# Patient Record
Sex: Male | Born: 1970 | Race: Black or African American | Hispanic: No | Marital: Married | Smoking: Never smoker
Health system: Southern US, Community
[De-identification: ages and names within clinical notes are randomized; demographics above are authoritative.]

## PROBLEM LIST (undated history)

## (undated) DIAGNOSIS — K219 Gastro-esophageal reflux disease without esophagitis: Secondary | ICD-10-CM

## (undated) DIAGNOSIS — M1990 Unspecified osteoarthritis, unspecified site: Secondary | ICD-10-CM

## (undated) HISTORY — DX: Unspecified osteoarthritis, unspecified site: M19.90

---

## 2008-08-25 ENCOUNTER — Emergency Department (HOSPITAL_COMMUNITY): Admission: EM | Admit: 2008-08-25 | Discharge: 2008-08-25 | Payer: Self-pay | Admitting: Family Medicine

## 2008-10-16 ENCOUNTER — Ambulatory Visit (HOSPITAL_COMMUNITY): Admission: RE | Admit: 2008-10-16 | Discharge: 2008-10-16 | Payer: Self-pay | Admitting: Internal Medicine

## 2009-06-05 ENCOUNTER — Encounter: Admission: RE | Admit: 2009-06-05 | Discharge: 2009-06-05 | Payer: Self-pay | Admitting: Internal Medicine

## 2010-10-07 LAB — CBC
HCT: 48.9 % (ref 39.0–52.0)
Hemoglobin: 16.3 g/dL (ref 13.0–17.0)
MCHC: 33.3 g/dL (ref 30.0–36.0)
RBC: 6.08 MIL/uL — ABNORMAL HIGH (ref 4.22–5.81)

## 2010-10-07 LAB — DIFFERENTIAL
Basophils Relative: 0 % (ref 0–1)
Lymphocytes Relative: 32 % (ref 12–46)
Monocytes Absolute: 0.4 10*3/uL (ref 0.1–1.0)
Monocytes Relative: 8 % (ref 3–12)
Neutro Abs: 2.8 10*3/uL (ref 1.7–7.7)

## 2014-09-20 ENCOUNTER — Encounter (HOSPITAL_COMMUNITY): Payer: Self-pay | Admitting: Emergency Medicine

## 2014-09-20 ENCOUNTER — Emergency Department (HOSPITAL_COMMUNITY)
Admission: EM | Admit: 2014-09-20 | Discharge: 2014-09-20 | Disposition: A | Discharge status: Home / Self Care | Payer: 59 | Attending: Emergency Medicine | Admitting: Emergency Medicine

## 2014-09-20 ENCOUNTER — Emergency Department (HOSPITAL_COMMUNITY): Payer: 59

## 2014-09-20 DIAGNOSIS — M25461 Effusion, right knee: Secondary | ICD-10-CM | POA: Diagnosis not present

## 2014-09-20 DIAGNOSIS — M25561 Pain in right knee: Secondary | ICD-10-CM | POA: Diagnosis present

## 2014-09-20 DIAGNOSIS — M1710 Unilateral primary osteoarthritis, unspecified knee: Secondary | ICD-10-CM

## 2014-09-20 DIAGNOSIS — M1711 Unilateral primary osteoarthritis, right knee: Secondary | ICD-10-CM | POA: Diagnosis not present

## 2014-09-20 MED ORDER — IBUPROFEN 800 MG PO TABS: 800.0000 mg | ORAL_TABLET | Freq: Three times a day (TID) | ORAL | Status: DC

## 2014-09-20 NOTE — ED Provider Notes (Signed)
CSN: [id number]     Arrival date & time 09/20/14  1433 History  This chart was scribed for Teressa Lower, NP working with Linwood Dibbles, MD by Evon Slack, ED Scribe. This patient was seen in room TR10C/TR10C and the patient's care was started at 2:43 PM.   Chief Complaint  Patient presents with  . Knee Pain   Patient is a 44 y.o. male presenting with knee pain. The history is provided by the patient. No language interpreter was used.  Knee Pain  HPI Comments: Bradley Daniels is a 44 y.o. male who presents to the Emergency Department complaining of intermittent bilateral knee pain for the past 2 weeks. Pt states that the right knee worse than the left with associated swelling. Pt states that he takes ibuprofen with no relief. Pt states that he is on his feet a lot at work.  Pt doesn't report numbness or weakness.   History reviewed. No pertinent past medical history. History reviewed. No pertinent past surgical history. No family history on file. History  Substance Use Topics  . Smoking status: Never Smoker   . Smokeless tobacco: Not on file  . Alcohol Use: No    Review of Systems  Musculoskeletal: Positive for joint swelling and arthralgias.  Neurological: Negative for weakness and numbness.  All other systems reviewed and are negative.     Allergies  Review of patient's allergies indicates no known allergies.  Home Medications   Prior to Admission medications   Not on File   BP 135/82 mmHg  Pulse 79  Temp(Src) 98.4 F (36.9 C) (Oral)  Resp 18  Ht  (1.956 m)  Wt 222 lb 6 oz (100.869 kg)  BMI 26.36 kg/m2  SpO2 100%   Physical Exam  Constitutional: He is oriented to person, place, and time. He appears well-developed and well-nourished. No distress.  HENT:  Head: Normocephalic and atraumatic.  Eyes: Conjunctivae and EOM are normal.  Neck: Neck supple. No tracheal deviation present.  Cardiovascular: Normal rate.   Pulmonary/Chest: Effort normal. No  respiratory distress.  Musculoskeletal: Normal range of motion.  Mild swelling without redness or warmth. Pt has full rom  Neurological: He is alert and oriented to person, place, and time.  Skin: Skin is warm and dry.  Psychiatric: He has a normal mood and affect. His behavior is normal.  Nursing note and vitals reviewed.   ED Course  Procedures (including critical care time) DIAGNOSTIC STUDIES: Oxygen Saturation is 100% on RA, normal by my interpretation.    COORDINATION OF CARE: 2:47 PM-Discussed treatment plan with pt at bedside and pt agreed to plan.     Labs Review Labs Reviewed - No data to display  Imaging Review Dg Knee 2 Views Right  09/20/2014   CLINICAL DATA:  Knee pain  EXAM: RIGHT KNEE - 1-2 VIEW  COMPARISON:  None.  FINDINGS: Two views of the right knee submitted. No acute fracture or subluxation. There is mild narrowing of medial joint compartment. Mild spurring of medial femoral condyle. Small joint effusion. Mild narrowing of patellofemoral joint space.  IMPRESSION: No acute fracture or subluxation. Mild degenerative changes. Small joint effusion.   Electronically Signed   By: Natasha Mead M.D.   On: 09/20/2014 15:19     EKG Interpretation None      MDM   Final diagnoses:  Arthritis of knee  Knee effusion, right    Discussed follow up with ortho. Discussed symptomatic relief at home   I personally performed the  services described in this documentation, which was scribed in my presence. The recorded information has been reviewed and is accurate.      Teressa LowerVrinda Medford Staheli, NP 09/20/14 1536  Linwood DibblesJon Knapp, MD 09/21/14 [phone]

## 2014-09-20 NOTE — ED Notes (Signed)
Pt c/o B/L knee pain x 2 weeks. Pt believes pain is coming from work where he has to stand on his feet a lot. Pt ambulatory in triage.

## 2014-09-20 NOTE — Discharge Instructions (Signed)
Arthritis, Nonspecific °Arthritis is pain, redness, warmth, or puffiness (inflammation) of a joint. The joint may be stiff or hurt when you move it. One or more joints may be affected. There are many types of arthritis. Your doctor may not know what type you have right away. The most common cause of arthritis is wear and tear on the joint (osteoarthritis). °HOME CARE  °· Only take medicine as told by your doctor. °· Rest the joint as much as possible. °· Raise (elevate) your joint if it is puffy. °· Use crutches if the painful joint is in your leg. °· Drink enough fluids to keep your pee (urine) clear or pale yellow. °· Follow your doctor's diet instructions. °· Use cold packs for very bad joint pain for 10 to 15 minutes every hour. Ask your doctor if it is okay for you to use hot packs. °· Exercise as told by your doctor. °· Take a warm shower if you have stiffness in the morning. °· Move your sore joints throughout the day. °GET HELP RIGHT AWAY IF:  °· You have a fever. °· You have very bad joint pain, puffiness, or redness. °· You have many joints that are painful and puffy. °· You are not getting better with treatment. °· You have very bad back pain or leg weakness. °· You cannot control when you poop (bowel movement) or pee (urinate). °· You do not feel better in 24 hours or are getting worse. °· You are having side effects from your medicine. °MAKE SURE YOU:  °· Understand these instructions. °· Will watch your condition. °· Will get help right away if you are not doing well or get worse. °Document Released: 09/07/2009 Document Revised: 12/13/2011 Document Reviewed: 09/07/2009 °[employer]® Patient Information ©2015 [employer]. This information is not intended to replace advice given to you by your health care provider. Make sure you discuss any questions you have with your health care provider. ° °

## 2014-09-20 NOTE — ED Notes (Signed)
Declined W/C at D/C and was escorted to lobby by RN. 

## 2015-05-26 ENCOUNTER — Encounter (HOSPITAL_COMMUNITY): Payer: Self-pay

## 2015-05-26 ENCOUNTER — Emergency Department (HOSPITAL_COMMUNITY)
Admission: EM | Admit: 2015-05-26 | Discharge: 2015-05-26 | Disposition: A | Discharge status: Home / Self Care | Payer: 59 | Attending: Physician Assistant | Admitting: Physician Assistant

## 2015-05-26 DIAGNOSIS — Z791 Long term (current) use of non-steroidal anti-inflammatories (NSAID): Secondary | ICD-10-CM | POA: Insufficient documentation

## 2015-05-26 DIAGNOSIS — L03115 Cellulitis of right lower limb: Secondary | ICD-10-CM | POA: Diagnosis not present

## 2015-05-26 DIAGNOSIS — Z8719 Personal history of other diseases of the digestive system: Secondary | ICD-10-CM | POA: Insufficient documentation

## 2015-05-26 DIAGNOSIS — Z4800 Encounter for change or removal of nonsurgical wound dressing: Secondary | ICD-10-CM | POA: Diagnosis present

## 2015-05-26 HISTORY — DX: Gastro-esophageal reflux disease without esophagitis: K21.9

## 2015-05-26 MED ORDER — CEPHALEXIN 250 MG PO CAPS
500.0000 mg | ORAL_CAPSULE | Freq: Once | ORAL | Status: AC
  Administered 2015-05-26: 500 mg via ORAL
  Filled 2015-05-26: qty 2

## 2015-05-26 MED ORDER — CEPHALEXIN 500 MG PO CAPS: 500.0000 mg | ORAL_CAPSULE | Freq: Four times a day (QID) | ORAL | Status: DC

## 2015-05-26 NOTE — ED Notes (Signed)
Per Pt, Patient started to have small wound on shin of the right lef that has not gotten better since Thursday. Patient reports pain and purulent drainage from the site. Pt denies fevers, chills, or diaphoresis.

## 2015-05-26 NOTE — Discharge Instructions (Signed)
Take keflex as directed until gone. Refer to attached documents for more information. Return to the ED with worsening or concerning symptoms.  °

## 2015-05-26 NOTE — ED Notes (Signed)
See PA Note.

## 2015-05-26 NOTE — ED Provider Notes (Signed)
CSN: [id number]     Arrival date & time 05/26/15  0801 History   First MD Initiated Contact with Patient 05/26/15 0813     Chief Complaint  Patient presents with  . Wound Check     (Consider location/radiation/quality/duration/timing/severity/associated sxs/prior Treatment) HPI Comments: Patient is a 44 year old male who presents with a wound to his right lower leg that started 5 days ago. The area started small and gradually worsened since the onset. Patient reports moderate, dull pain without radiation. Palpation makes the pain worse. No alleviating factors. Patient reports some associated bloody drainage from the area. No other associated symptoms.   Patient is a 44 y.o. male presenting with wound check. The history is provided by the patient. No language interpreter was used.  Wound Check This is a new problem. The current episode started in the past 7 days. The problem occurs constantly. The problem has been unchanged. Pertinent negatives include no abdominal pain, change in bowel habit, chest pain, chills, congestion, coughing, fatigue, fever, headaches, joint swelling, myalgias, rash or vertigo. Exacerbated by: palpation. He has tried rest for the symptoms. The treatment provided no relief.    Past Medical History  Diagnosis Date  . GERD (gastroesophageal reflux disease)    History reviewed. No pertinent past surgical history. No family history on file. Social History  Substance Use Topics  . Smoking status: Never Smoker   . Smokeless tobacco: None  . Alcohol Use: No    Review of Systems  Constitutional: Negative for fever, chills and fatigue.  HENT: Negative for congestion.   Respiratory: Negative for cough.   Cardiovascular: Negative for chest pain.  Gastrointestinal: Negative for abdominal pain and change in bowel habit.  Musculoskeletal: Negative for myalgias and joint swelling.  Skin: Positive for wound. Negative for rash.  Neurological: Negative for vertigo and  headaches.  All other systems reviewed and are negative.     Allergies  Review of patient's allergies indicates no known allergies.  Home Medications   Prior to Admission medications   Medication Sig Start Date End Date Taking? Authorizing Provider  ibuprofen (ADVIL,MOTRIN) 800 MG tablet Take 1 tablet (800 mg total) by mouth 3 (three) times daily. 09/20/14  Yes Teressa LowerVrinda Pickering, NP   There were no vitals taken for this visit. Physical Exam  Constitutional: He is oriented to person, place, and time. He appears well-developed and well-nourished. No distress.  HENT:  Head: Normocephalic and atraumatic.  Eyes: Conjunctivae and EOM are normal.  Neck: Normal range of motion.  Cardiovascular: Normal rate and regular rhythm.  Exam reveals no gallop and no friction rub.   No murmur heard. Pulmonary/Chest: Effort normal and breath sounds normal. He has no wheezes. He has no rales. He exhibits no tenderness.  Abdominal: Soft. He exhibits no distension. There is no tenderness. There is no rebound.  Musculoskeletal: Normal range of motion.  Neurological: He is alert and oriented to person, place, and time. Coordination normal.  Speech is goal-oriented. Moves limbs without ataxia.   Skin: Skin is warm and dry.  4x4cm area of induration and warmth of the right anterior lower leg. Small open area in the center without drainage at this time.   Psychiatric: He has a normal mood and affect. His behavior is normal.  Nursing note and vitals reviewed.   ED Course  Procedures (including critical care time) Labs Review Labs Reviewed - No data to display  Imaging Review No results found. I have personally reviewed and evaluated these images  and lab results as part of my medical decision-making.   EKG Interpretation None      MDM   Final diagnoses:  Cellulitis of right anterior lower leg    8:14 AM Patient has cellulitis of right anterior lower leg from a possible insect bite. Patient  has no fevers or any other complaints. Patient will be treated with oral keflex. The area is open with small amount of drainage. Surrounding induration-no I&D needed at this time. Vitals stable and patient afebrile.    Emilia Beck, PA-C 05/26/15 1012  Courteney Randall An, MD 05/26/15 1533

## 2017-05-09 ENCOUNTER — Other Ambulatory Visit (INDEPENDENT_AMBULATORY_CARE_PROVIDER_SITE_OTHER): Payer: 59

## 2017-05-09 ENCOUNTER — Encounter: Payer: Self-pay | Admitting: Internal Medicine

## 2017-05-09 ENCOUNTER — Ambulatory Visit (INDEPENDENT_AMBULATORY_CARE_PROVIDER_SITE_OTHER): Payer: 59 | Admitting: Internal Medicine

## 2017-05-09 VITALS — BP 124/84 | HR 75 | Temp 98.8°F | Resp 16 | Ht 77.0 in | Wt 234.2 lb

## 2017-05-09 DIAGNOSIS — M170 Bilateral primary osteoarthritis of knee: Secondary | ICD-10-CM | POA: Diagnosis not present

## 2017-05-09 DIAGNOSIS — Z202 Contact with and (suspected) exposure to infections with a predominantly sexual mode of transmission: Secondary | ICD-10-CM

## 2017-05-09 DIAGNOSIS — Z23 Encounter for immunization: Secondary | ICD-10-CM | POA: Diagnosis not present

## 2017-05-09 DIAGNOSIS — Z0000 Encounter for general adult medical examination without abnormal findings: Secondary | ICD-10-CM | POA: Insufficient documentation

## 2017-05-09 DIAGNOSIS — M1000 Idiopathic gout, unspecified site: Secondary | ICD-10-CM

## 2017-05-09 LAB — CBC WITH DIFFERENTIAL/PLATELET
Basophils Absolute: 0 10*3/uL (ref 0.0–0.1)
Basophils Relative: 0.4 % (ref 0.0–3.0)
EOS PCT: 5 % (ref 0.0–5.0)
Eosinophils Absolute: 0.3 10*3/uL (ref 0.0–0.7)
HCT: 44.7 % (ref 39.0–52.0)
Hemoglobin: 14.4 g/dL (ref 13.0–17.0)
LYMPHS ABS: 2.3 10*3/uL (ref 0.7–4.0)
Lymphocytes Relative: 40.9 % (ref 12.0–46.0)
MCHC: 32.3 g/dL (ref 30.0–36.0)
MCV: 81.2 fl (ref 78.0–100.0)
MONOS PCT: 8.5 % (ref 3.0–12.0)
Monocytes Absolute: 0.5 10*3/uL (ref 0.1–1.0)
NEUTROS ABS: 2.6 10*3/uL (ref 1.4–7.7)
Neutrophils Relative %: 45.2 % (ref 43.0–77.0)
PLATELETS: 270 10*3/uL (ref 150.0–400.0)
RBC: 5.5 Mil/uL (ref 4.22–5.81)
RDW: 13.6 % (ref 11.5–15.5)
WBC: 5.7 10*3/uL (ref 4.0–10.5)

## 2017-05-09 LAB — URINALYSIS, ROUTINE W REFLEX MICROSCOPIC
Bilirubin Urine: NEGATIVE
Hgb urine dipstick: NEGATIVE
Ketones, ur: NEGATIVE
Leukocytes, UA: NEGATIVE
Nitrite: NEGATIVE
SPECIFIC GRAVITY, URINE: 1.015 (ref 1.000–1.030)
TOTAL PROTEIN, URINE-UPE24: NEGATIVE
URINE GLUCOSE: NEGATIVE
Urobilinogen, UA: 0.2 (ref 0.0–1.0)
WBC, UA: NONE SEEN — AB (ref 0–?)
pH: 7.5 (ref 5.0–8.0)

## 2017-05-09 LAB — COMPREHENSIVE METABOLIC PANEL
ALT: 18 U/L (ref 0–53)
AST: 20 U/L (ref 0–37)
Albumin: 4.3 g/dL (ref 3.5–5.2)
Alkaline Phosphatase: 87 U/L (ref 39–117)
BUN: 10 mg/dL (ref 6–23)
CHLORIDE: 105 meq/L (ref 96–112)
CO2: 29 meq/L (ref 19–32)
Calcium: 9.6 mg/dL (ref 8.4–10.5)
Creatinine, Ser: 0.97 mg/dL (ref 0.40–1.50)
GFR: 105.85 mL/min (ref >60.00)
GLUCOSE: 96 mg/dL (ref 70–99)
POTASSIUM: 4.3 meq/L (ref 3.5–5.1)
SODIUM: 139 meq/L (ref 135–145)
Total Bilirubin: 0.5 mg/dL (ref 0.2–1.2)
Total Protein: 7.1 g/dL (ref 6.0–8.3)

## 2017-05-09 LAB — LIPID PANEL
CHOL/HDL RATIO: 3
CHOLESTEROL: 132 mg/dL (ref 0–200)
HDL: 46.3 mg/dL (ref >39.00)
LDL CALC: 69 mg/dL (ref 0–99)
NonHDL: 86.05
Triglycerides: 87 mg/dL (ref 0.0–149.0)
VLDL: 17.4 mg/dL (ref 0.0–40.0)

## 2017-05-09 LAB — PSA: PSA: 0.75 ng/mL (ref 0.10–4.00)

## 2017-05-09 LAB — TSH: TSH: 1.78 u[IU]/mL (ref 0.35–4.50)

## 2017-05-09 LAB — URIC ACID: Uric Acid, Serum: 7.2 mg/dL (ref 4.0–7.8)

## 2017-05-09 MED ORDER — MELOXICAM 15 MG PO TABS: 15.0000 mg | ORAL_TABLET | Freq: Every day | ORAL | 1 refills | Status: DC

## 2017-05-09 NOTE — Patient Instructions (Signed)

## 2017-05-09 NOTE — Progress Notes (Signed)
Subjective:  Patient ID: Bradley Daniels, male    DOB: 04/14/1971  Age: 46 y.o. MRN: [id number]  CC: Annual Exam  NEW TO ME  HPI Bradley Daniels presents for a CPX.  He complains of chronic, bilateral knee pain.  He tells me that a year ago his left knee swelled and he went to see an orthopedist and had fluid drained.  He thinks they told him he had gout.  He has had no recent episodes of redness or swelling in his knees.  He wants to try a prescription medication to relieve the pain.  He also needs to be tested for syphilis.  His wife is currently being treated for late latent syphilis.  Outpatient Medications Prior to Visit  Medication Sig Dispense Refill  . cephALEXin (KEFLEX) 500 MG capsule Take 1 capsule (500 mg total) by mouth 4 (four) times daily. 40 capsule 0  . ibuprofen (ADVIL,MOTRIN) 800 MG tablet Take 1 tablet (800 mg total) by mouth 3 (three) times daily. 21 tablet 0   No facility-administered medications prior to visit.     ROS Review of Systems  Constitutional: Negative.  Negative for chills, fatigue and fever.  HENT: Negative.  Negative for sinus pressure, sore throat and trouble swallowing.   Eyes: Negative.  Negative for visual disturbance.  Respiratory: Negative for cough, chest tightness, shortness of breath and wheezing.   Cardiovascular: Negative.  Negative for chest pain, palpitations and leg swelling.  Gastrointestinal: Negative for abdominal pain, blood in stool, constipation, diarrhea, nausea and vomiting.  Endocrine: Negative.   Genitourinary: Negative.  Negative for difficulty urinating, discharge, frequency, genital sores, penile pain, penile swelling, scrotal swelling, testicular pain and urgency.  Musculoskeletal: Positive for arthralgias.  Skin: Negative.  Negative for rash.  Allergic/Immunologic: Negative.   Neurological: Negative.  Negative for dizziness and headaches.  Hematological: Negative for adenopathy. Does not bruise/bleed easily.    Psychiatric/Behavioral: Negative.     Objective:  BP 124/84 (BP Location: Left Arm, Patient Position: Sitting, Cuff Size: Large)   Pulse 75   Temp 98.8 F (37.1 C) (Oral)   Resp 16   Ht 6\' 5"  (1.956 m)   Wt 234 lb 4 oz (106.3 kg)   SpO2 98%   BMI 27.78 kg/m   BP Readings from Last 3 Encounters:  05/09/17 124/84  05/26/15 115/93  09/20/14 135/82    Wt Readings from Last 3 Encounters:  05/09/17 234 lb 4 oz (106.3 kg)  09/20/14 222 lb 6 oz (100.9 kg)    Physical Exam  Constitutional: He is oriented to person, place, and time. No distress.  HENT:  Mouth/Throat: Oropharynx is clear and moist. No oropharyngeal exudate.  Eyes: Conjunctivae are normal. Right eye exhibits no discharge. Left eye exhibits no discharge. No scleral icterus.  Neck: Normal range of motion. Neck supple. No JVD present. No thyromegaly present.  Cardiovascular: Normal rate, regular rhythm and intact distal pulses. Exam reveals no gallop and no friction rub.  No murmur heard. Pulmonary/Chest: Effort normal and breath sounds normal. No respiratory distress. He has no wheezes. He has no rales. He exhibits no tenderness.  Abdominal: Soft. Bowel sounds are normal. He exhibits no distension and no mass. There is no tenderness. There is no rebound and no guarding. Hernia confirmed negative in the right inguinal area and confirmed negative in the left inguinal area.  Genitourinary: Rectum normal, prostate normal and penis normal. Rectal exam shows no external hemorrhoid, no internal hemorrhoid, no fissure, no mass, no tenderness, anal  tone normal and guaiac negative stool. Prostate is not enlarged and not tender. Right testis shows no mass, no swelling and no tenderness. Right testis is descended. Left testis shows no mass, no swelling and no tenderness. Left testis is descended. Circumcised. No penile erythema or penile tenderness. No discharge found.  Musculoskeletal: Normal range of motion. He exhibits no edema or  tenderness.       Right knee: He exhibits deformity (DJD). He exhibits normal range of motion, no swelling, no effusion, no ecchymosis, no erythema, normal alignment and no bony tenderness. No tenderness found.       Left knee: He exhibits deformity (DJD). He exhibits normal range of motion, no swelling, no effusion, no erythema and no bony tenderness. No tenderness found.  Lymphadenopathy:    He has no cervical adenopathy.       Right: No inguinal adenopathy present.       Left: No inguinal adenopathy present.  Neurological: He is alert and oriented to person, place, and time.  Skin: Skin is warm and dry. No rash noted. He is not diaphoretic. No erythema. No pallor.  Vitals reviewed.   Lab Results  Component Value Date   WBC 5.7 05/09/2017   HGB 14.4 05/09/2017   HCT 44.7 05/09/2017   PLT 270.0 05/09/2017   GLUCOSE 96 05/09/2017   CHOL 132 05/09/2017   TRIG 87.0 05/09/2017   HDL 46.30 05/09/2017   LDLCALC 69 05/09/2017   ALT 18 05/09/2017   AST 20 05/09/2017   NA 139 05/09/2017   K 4.3 05/09/2017   CL 105 05/09/2017   CREATININE 0.97 05/09/2017   BUN 10 05/09/2017   CO2 29 05/09/2017   TSH 1.78 05/09/2017   PSA 0.75 05/09/2017    No results found.  Assessment & Plan:   Ivery QualeBassirou was seen today for annual exam.  Diagnoses and all orders for this visit:  Exposure to syphilis- his RPR is negative -     RPR; Future  Routine general medical examination at a health care facility- Exam completed, labs reviewed, vaccines reviewed and updated, patient education material was given. -     Lipid panel; Future -     Comprehensive metabolic panel; Future -     CBC with Differential/Platelet; Future -     PSA; Future -     TSH; Future -     Urinalysis, Routine w reflex microscopic; Future -     HIV antibody; Future  Idiopathic gout, unspecified chronicity, unspecified site- if his episode of left knee swelling was gout it was his one and and his uric acid level is not  significantly elevated so at this time I do not recommend that he take colchicine or a XO inhibitor.  If he has another episode of gouty arthropathy then will reconsider. -     Uric acid; Future  Primary osteoarthritis of both knees -     meloxicam (MOBIC) 15 MG tablet; Take 1 tablet (15 mg total) daily by mouth.  Other orders -     Tdap vaccine greater than or equal to 7yo IM   I have discontinued Deondrae Baltzell's ibuprofen and cephALEXin. I am also having him start on meloxicam.  Meds ordered this encounter  Medications  . meloxicam (MOBIC) 15 MG tablet    Sig: Take 1 tablet (15 mg total) daily by mouth.    Dispense:  90 tablet    Refill:  1     Follow-up: Return if symptoms worsen or fail  to improve.  Scarlette Calico, MD

## 2017-05-10 ENCOUNTER — Encounter: Payer: Self-pay | Admitting: Internal Medicine

## 2017-05-10 LAB — HIV ANTIBODY (ROUTINE TESTING W REFLEX): HIV: NONREACTIVE

## 2017-05-10 LAB — RPR: RPR: NONREACTIVE

## 2017-06-27 ENCOUNTER — Encounter (HOSPITAL_COMMUNITY): Payer: Self-pay | Admitting: Emergency Medicine

## 2017-06-27 ENCOUNTER — Other Ambulatory Visit: Payer: Self-pay

## 2017-06-27 ENCOUNTER — Ambulatory Visit (HOSPITAL_COMMUNITY)
Admission: EM | Admit: 2017-06-27 | Discharge: 2017-06-27 | Disposition: A | Discharge status: Home / Self Care | Payer: No Typology Code available for payment source | Attending: Physician Assistant | Admitting: Physician Assistant

## 2017-06-27 DIAGNOSIS — S81811A Laceration without foreign body, right lower leg, initial encounter: Secondary | ICD-10-CM

## 2017-06-27 DIAGNOSIS — W208XXA Other cause of strike by thrown, projected or falling object, initial encounter: Secondary | ICD-10-CM | POA: Diagnosis not present

## 2017-06-27 MED ORDER — MUPIROCIN 2 % EX OINT: 1.0000 "application " | TOPICAL_OINTMENT | Freq: Two times a day (BID) | CUTANEOUS | 0 refills | Status: DC

## 2017-06-27 MED ORDER — LIDOCAINE-EPINEPHRINE (PF) 2 %-1:200000 IJ SOLN
INTRAMUSCULAR | Status: AC
  Filled 2017-06-27: qty 20

## 2017-06-27 NOTE — ED Triage Notes (Signed)
Table fell onto right lower leg, laceration.  Incident occurred today approx 30 minutes prior to arrival.  "l" shaped laceration to right lower leg

## 2017-06-27 NOTE — ED Provider Notes (Addendum)
[facility]    CSN: [id number] Arrival date & time: 06/27/17  1317     History   Chief Complaint Chief Complaint  Patient presents with  . Extremity Laceration    HPI Bradley Daniels is a 47 y.o. male.   HPI  Cut leg on table today.  TD up to date. No weakness.    Past Medical History:  Diagnosis Date  . Arthritis   . GERD (gastroesophageal reflux disease)     Patient Active Problem List   Diagnosis Date Noted  . Exposure to syphilis 05/09/2017  . Routine general medical examination at a health care facility 05/09/2017  . Idiopathic gout 05/09/2017  . Primary osteoarthritis of both knees 05/09/2017    History reviewed. No pertinent surgical history.     Home Medications    Prior to Admission medications   Medication Sig Start Date End Date Taking? Authorizing Provider  meloxicam (MOBIC) 15 MG tablet Take 1 tablet (15 mg total) daily by mouth. 05/09/17   [name], MD  mupirocin ointment (BACTROBAN) 2 % Apply 1 application topically 2 (two) times daily. 06/27/17   Ofilia Neas, PA-C    Family History Family History  Problem Relation Age of Onset  . Cancer Neg Hx   . Drug abuse Neg Hx   . Early death Neg Hx   . Heart disease Neg Hx   . Hyperlipidemia Neg Hx   . Hypertension Neg Hx   . Kidney disease Neg Hx   . Stroke Neg Hx     Social History Social History   Tobacco Use  . Smoking status: Never Smoker  . Smokeless tobacco: Never Used  Substance Use Topics  . Alcohol use: No  . Drug use: No     Allergies   Patient has no known allergies.   Review of Systems Review of Systems  Neurological: Negative for dizziness and weakness.     Physical Exam Triage Vital Signs ED Triage Vitals  Enc Vitals Group     BP 06/27/17 1350 135/88     Pulse Rate 06/27/17 1350 70     Resp 06/27/17 1350 20     Temp 06/27/17 1350 98.2 F (36.8 C)     Temp Source 06/27/17 1350 Oral     SpO2 06/27/17 1350 100 %     Weight --    Height --      Head Circumference --      Peak Flow --      Pain Score 06/27/17 1348 6     Pain Loc --      Pain Edu? --      Excl. in GC? --    No data found.  Updated Vital Signs BP 135/88 (BP Location: Left Arm) Comment (BP Location): large cuff  Pulse 70   Temp 98.2 F (36.8 C) (Oral)   Resp 20   SpO2 100%   Visual Acuity Right Eye Distance:   Left Eye Distance:   Bilateral Distance:    Right Eye Near:   Left Eye Near:    Bilateral Near:     Physical Exam  Cardiovascular: Normal rate.  Pulmonary/Chest: Effort normal.  Musculoskeletal: He exhibits no edema or tenderness.       Legs:    UC Treatments / Results  Labs (all labs ordered are listed, but only abnormal results are displayed) Labs Reviewed - No data to display  EKG  EKG Interpretation None  Radiology No results found.  Procedures Laceration Repair Date/Time: 06/27/2017 3:08 PM Performed by: Ofilia Neaslark, Domonic Hiscox L, PA-C Authorized by: Ofilia Neaslark, Lakaya Tolen L, PA-C   Consent:    Consent obtained:  Verbal   Consent given by:  Patient   Risks discussed:  Infection Anesthesia (see MAR for exact dosages):    Anesthesia method:  Local infiltration   Local anesthetic:  Lidocaine 2% WITH epi Laceration details:    Location:  Leg   Leg location:  R lower leg   Length (cm):  4 Repair type:    Repair type:  Simple Pre-procedure details:    Preparation:  Patient was prepped and draped in usual sterile fashion Treatment:    Area cleansed with:  Soap and water   Amount of cleaning:  Standard   Irrigation solution:  Sterile saline   Visualized foreign bodies/material removed: no   Skin repair:    Repair method:  Sutures   Suture size:  4-0   Suture technique:  Horizontal mattress Approximation:    Approximation:  Close   Vermilion border: well-aligned   Post-procedure details:    Dressing:  Non-adherent dressing   Patient tolerance of procedure:  Tolerated well, no immediate complications     (including critical care time)  Medications Ordered in UC Medications - No data to display   Initial Impression / Assessment and Plan / UC Course  I have reviewed the triage vital signs and the nursing notes.  Pertinent labs & imaging results that were available during my care of the patient were reviewed by me and considered in my medical decision making (see chart for details).     Repaired. The wound was under quite a lot of tension. RTC in ten days for removal. RTC sooner if porblematic.   Final Clinical Impressions(s) / UC Diagnoses   Final diagnoses:  Laceration of right lower extremity, initial encounter    ED Discharge Orders        Ordered    mupirocin ointment (BACTROBAN) 2 %  2 times daily     06/27/17 1505       Controlled Substance Prescriptions [place] Controlled Substance Registry consulted? No   Ofilia NeasClark, Toluwani Yadav L, PA-C 06/27/17 1512    Ofilia Neaslark, Kashaun Bebo L, PA-C 06/27/17 1512

## 2017-06-27 NOTE — Discharge Instructions (Signed)
Ten days for suture removal.

## 2017-06-27 NOTE — ED Notes (Signed)
Laceration wrapped with antibiotic ointment and nonstick gauze. Pt given supplies to go home.

## 2017-07-06 ENCOUNTER — Ambulatory Visit (HOSPITAL_COMMUNITY)
Admission: EM | Admit: 2017-07-06 | Discharge: 2017-07-06 | Disposition: A | Discharge status: Home / Self Care | Payer: 59 | Attending: Family Medicine | Admitting: Family Medicine

## 2017-07-06 ENCOUNTER — Encounter (HOSPITAL_COMMUNITY): Payer: Self-pay | Admitting: Emergency Medicine

## 2017-07-06 DIAGNOSIS — S81811D Laceration without foreign body, right lower leg, subsequent encounter: Secondary | ICD-10-CM | POA: Diagnosis not present

## 2017-07-06 DIAGNOSIS — Z4802 Encounter for removal of sutures: Secondary | ICD-10-CM | POA: Diagnosis not present

## 2017-07-06 NOTE — ED Provider Notes (Signed)
  [facility]   [id number] 07/06/17 Arrival Time: 1004  ASSESSMENT & PLAN:  1. Visit for suture removal    Seven sutures removed without complication. Should continue to heal well. Continue current wound care. F/U as needed.  Reviewed expectations re: course of current medical issues. Questions answered. Outlined signs and symptoms indicating need for more acute intervention. Patient verbalized understanding. After Visit Summary given.   SUBJECTIVE:  Bradley Daniels is a 47 y.o. male who presents with a sutured laceration to his RLE. No concerns. Here for suture removal.  ROS: As per HPI.   OBJECTIVE:  General appearance: alert; no distress Skin: sutured wound to anterior RLE; healing well Psychological:  alert and cooperative; normal mood and affect   No Known Allergies      Mardella LaymanHagler, Devon Kingdon, MD 07/06/17 1037

## 2017-07-06 NOTE — ED Triage Notes (Signed)
PT C/O: here for a f/u on laceration and to have stitches removed  A&O x4... NAD... Ambulatory

## 2017-12-24 ENCOUNTER — Other Ambulatory Visit: Payer: Self-pay | Admitting: Internal Medicine

## 2017-12-24 DIAGNOSIS — M170 Bilateral primary osteoarthritis of knee: Secondary | ICD-10-CM

## 2018-02-08 ENCOUNTER — Encounter (HOSPITAL_BASED_OUTPATIENT_CLINIC_OR_DEPARTMENT_OTHER): Payer: Self-pay

## 2018-02-08 ENCOUNTER — Other Ambulatory Visit: Payer: Self-pay

## 2018-02-08 ENCOUNTER — Emergency Department (HOSPITAL_BASED_OUTPATIENT_CLINIC_OR_DEPARTMENT_OTHER)
Admission: EM | Admit: 2018-02-08 | Discharge: 2018-02-09 | Disposition: A | Discharge status: Home / Self Care | Payer: Worker's Compensation | Attending: Emergency Medicine | Admitting: Emergency Medicine

## 2018-02-08 ENCOUNTER — Emergency Department (HOSPITAL_BASED_OUTPATIENT_CLINIC_OR_DEPARTMENT_OTHER): Payer: Worker's Compensation

## 2018-02-08 DIAGNOSIS — Y990 Civilian activity done for income or pay: Secondary | ICD-10-CM | POA: Diagnosis not present

## 2018-02-08 DIAGNOSIS — Y9259 Other trade areas as the place of occurrence of the external cause: Secondary | ICD-10-CM | POA: Insufficient documentation

## 2018-02-08 DIAGNOSIS — Y9301 Activity, walking, marching and hiking: Secondary | ICD-10-CM | POA: Insufficient documentation

## 2018-02-08 DIAGNOSIS — S8992XA Unspecified injury of left lower leg, initial encounter: Secondary | ICD-10-CM | POA: Diagnosis present

## 2018-02-08 DIAGNOSIS — W010XXA Fall on same level from slipping, tripping and stumbling without subsequent striking against object, initial encounter: Secondary | ICD-10-CM | POA: Diagnosis not present

## 2018-02-08 NOTE — ED Triage Notes (Addendum)
Pt with near fall/twisted left knee stocking at work approx [id number]-steady gait-pt "lead person" Gaetano NetJeffrey Dalton with pt-states does not have paperwork for ED and pt does not need a post accident UDS

## 2018-02-09 MED ORDER — NAPROXEN 250 MG PO TABS
500.0000 mg | ORAL_TABLET | Freq: Once | ORAL | Status: AC
  Administered 2018-02-09: 500 mg via ORAL
  Filled 2018-02-09: qty 2

## 2018-02-09 MED ORDER — MELOXICAM 15 MG PO TABS: ORAL_TABLET | ORAL | 0 refills | Status: DC

## 2018-02-09 NOTE — ED Provider Notes (Signed)
[facility] Provider Note: Lowella DellJ. Lane Mical Brun, MD, FACEP  CSN: [id number] MRN: [id number] ARRIVAL: 02/08/18 at 2210 ROOM: [facility]1/[facility]1   CHIEF COMPLAINT  Knee Injury   HISTORY OF PRESENT ILLNESS  02/09/18 12:11 AM Bradley Daniels is a 47 y.o. male who fell at work twisting his left knee approximately 9 PM yesterday evening.  He is having pain in the medial aspect of the left knee which he rates as a 7 out of 10.  Pain is worse with attempted movement.  Range of motion of the left knee is limited and he has swelling over the medial aspect of the knee.  He is able to bear weight on that knee.  He denies other injury.   Past Medical History:  Diagnosis Date  . Arthritis   . GERD (gastroesophageal reflux disease)     History reviewed. No pertinent surgical history.  Family History  Problem Relation Age of Onset  . Cancer Neg Hx   . Drug abuse Neg Hx   . Early death Neg Hx   . Heart disease Neg Hx   . Hyperlipidemia Neg Hx   . Hypertension Neg Hx   . Kidney disease Neg Hx   . Stroke Neg Hx     Social History   Tobacco Use  . Smoking status: Never Smoker  . Smokeless tobacco: Never Used  Substance Use Topics  . Alcohol use: No  . Drug use: No    Prior to Admission medications   Medication Sig Start Date End Date Taking? Authorizing Provider  meloxicam (MOBIC) 15 MG tablet TAKE 1 TABLET BY MOUTH ONCE DAILY 12/25/17   Etta GrandchildJones, Thomas L, MD  mupirocin ointment (BACTROBAN) 2 % Apply 1 application topically 2 (two) times daily. 06/27/17   Ofilia Neaslark, Michael L, PA-C    Allergies Patient has no known allergies.   REVIEW OF SYSTEMS  Negative except as noted here or in the History of Present Illness.   PHYSICAL EXAMINATION  Initial Vital Signs Blood pressure (!) 140/98, pulse 62, temperature 98.4 F (36.9 C), temperature source Oral, resp. rate 18, height 6\' 2"  (1.88 m), weight 104.6 kg, SpO2 100 %.  Examination General: Well-developed, well-nourished male in no acute  distress; appearance consistent with age of record HENT: normocephalic; atraumatic Eyes: Normal appearance Neck: supple Heart: regular rate and rhythm Lungs: clear to auscultation bilaterally Abdomen: soft; nondistended; nontender; bowel sounds present Extremities: No deformity; swelling and tenderness of distal aspect of medial left knee with decreased range of motion, left lower extremity distally neurovascularly intact Neurologic: Awake, alert and oriented; motor function intact in all extremities and symmetric; no facial droop Skin: Warm and dry Psychiatric: Normal mood and affect   RESULTS  Summary of this visit's results, reviewed by myself:   EKG Interpretation  Date/Time:    Ventricular Rate:    PR Interval:    QRS Duration:   QT Interval:    QTC Calculation:   R Axis:     Text Interpretation:        Laboratory Studies: No results found for this or any previous visit (from the past 24 hour(s)). Imaging Studies: Dg Knee Complete 4 Views Left  Result Date: 02/08/2018 CLINICAL DATA:  Medial knee pain and swelling after fall with twisting injury today. EXAM: LEFT KNEE - COMPLETE 4+ VIEW COMPARISON:  None. FINDINGS: Degenerative changes in the left knee with medial greater than lateral compartment narrowing and tricompartment osteophyte formation. No evidence of acute fracture or dislocation. No focal bone  lesion or bone destruction. Bone cortex appears intact. There is heterogeneous medial soft tissue swelling over the left knee likely representing a soft tissue hematoma. No significant effusion. 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 Medial soft tissue swelling and hematoma. No acute bony abnormalities. Electronically Signed   By: Burman NievesWilliam  Stevens M.D.   On: 02/08/2018 23:53    ED COURSE and MDM  Nursing notes and initial vitals signs, including pulse oximetry, reviewed.  Vitals:   02/08/18 2220 02/08/18 2221  BP:  (!) 140/98  Pulse:  62    Resp:  18  Temp:  98.4 F (36.9 C)  TempSrc:  Oral  SpO2:  100%  Weight: 104.6 kg   Height: 6\' 2"  (1.88 m)    Suspect sprain of the left medial collateral ligament.  Radiographs suggest associated hematoma.  We will place a knee immobilizer.  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  PROCEDURES    ED DIAGNOSES     ICD-10-CM   1. Injury of left knee, initial encounter S89.92XA        Barbarita Hutmacher, Jonny RuizJohn, MD 02/09/18 [phone]

## 2018-04-25 ENCOUNTER — Other Ambulatory Visit (INDEPENDENT_AMBULATORY_CARE_PROVIDER_SITE_OTHER): Payer: 59

## 2018-04-25 ENCOUNTER — Ambulatory Visit (INDEPENDENT_AMBULATORY_CARE_PROVIDER_SITE_OTHER): Payer: 59 | Admitting: Internal Medicine

## 2018-04-25 ENCOUNTER — Encounter: Payer: Self-pay | Admitting: Internal Medicine

## 2018-04-25 VITALS — BP 130/80 | HR 67 | Temp 98.4°F | Resp 16 | Ht 74.0 in | Wt 233.0 lb

## 2018-04-25 DIAGNOSIS — M1000 Idiopathic gout, unspecified site: Secondary | ICD-10-CM

## 2018-04-25 DIAGNOSIS — L0103 Bullous impetigo: Secondary | ICD-10-CM | POA: Insufficient documentation

## 2018-04-25 DIAGNOSIS — Z202 Contact with and (suspected) exposure to infections with a predominantly sexual mode of transmission: Secondary | ICD-10-CM

## 2018-04-25 DIAGNOSIS — Z23 Encounter for immunization: Secondary | ICD-10-CM

## 2018-04-25 DIAGNOSIS — M170 Bilateral primary osteoarthritis of knee: Secondary | ICD-10-CM | POA: Diagnosis not present

## 2018-04-25 LAB — CBC WITH DIFFERENTIAL/PLATELET
BASOS ABS: 0 10*3/uL (ref 0.0–0.1)
BASOS PCT: 0.4 % (ref 0.0–3.0)
EOS ABS: 0.2 10*3/uL (ref 0.0–0.7)
Eosinophils Relative: 4.7 % (ref 0.0–5.0)
HEMATOCRIT: 39.8 % (ref 39.0–52.0)
Hemoglobin: 13.3 g/dL (ref 13.0–17.0)
LYMPHS PCT: 42 % (ref 12.0–46.0)
Lymphs Abs: 2.1 10*3/uL (ref 0.7–4.0)
MCHC: 33.5 g/dL (ref 30.0–36.0)
MCV: 79.7 fl (ref 78.0–100.0)
MONO ABS: 0.4 10*3/uL (ref 0.1–1.0)
Monocytes Relative: 7.6 % (ref 3.0–12.0)
NEUTROS ABS: 2.3 10*3/uL (ref 1.4–7.7)
Neutrophils Relative %: 45.3 % (ref 43.0–77.0)
PLATELETS: 236 10*3/uL (ref 150.0–400.0)
RBC: 4.99 Mil/uL (ref 4.22–5.81)
RDW: 13.4 % (ref 11.5–15.5)
WBC: 5 10*3/uL (ref 4.0–10.5)

## 2018-04-25 LAB — COMPREHENSIVE METABOLIC PANEL
ALT: 20 U/L (ref 0–53)
AST: 22 U/L (ref 0–37)
Albumin: 4.2 g/dL (ref 3.5–5.2)
Alkaline Phosphatase: 86 U/L (ref 39–117)
BILIRUBIN TOTAL: 0.5 mg/dL (ref 0.2–1.2)
BUN: 13 mg/dL (ref 6–23)
CALCIUM: 9.4 mg/dL (ref 8.4–10.5)
CHLORIDE: 106 meq/L (ref 96–112)
CO2: 28 meq/L (ref 19–32)
CREATININE: 1.18 mg/dL (ref 0.40–1.50)
GFR: 84.09 mL/min (ref >60.00)
Glucose, Bld: 99 mg/dL (ref 70–99)
Potassium: 4.1 mEq/L (ref 3.5–5.1)
Sodium: 140 mEq/L (ref 135–145)
Total Protein: 7.1 g/dL (ref 6.0–8.3)

## 2018-04-25 LAB — URIC ACID: URIC ACID, SERUM: 5.9 mg/dL (ref 4.0–7.8)

## 2018-04-25 LAB — SEDIMENTATION RATE: Sed Rate: 24 mm/hr — ABNORMAL HIGH (ref 0–15)

## 2018-04-25 MED ORDER — MELOXICAM 15 MG PO TABS: ORAL_TABLET | ORAL | 0 refills | Status: DC

## 2018-04-25 MED ORDER — TEDIZOLID PHOSPHATE 200 MG PO TABS: 1.0000 | ORAL_TABLET | Freq: Every day | ORAL | 0 refills | Status: DC

## 2018-04-25 NOTE — Progress Notes (Signed)
Subjective:  Patient ID: Bradley Daniels, male    DOB: March 25, 1971  Age: 47 y.o. MRN: [id number]  CC: Osteoarthritis and Rash   HPI Nasser Mclelland presents for a week history of painful skin bumps.  He has had a couple of pustules on his right arm and right leg and back of neck that have come and gone.  The area that concerns him the most is the medial aspect of the left calf.  A pustule developed there several days ago, it burst and drained thick yellow fluid, now he has a small blister with surrounding redness, pain, and swelling.  He also complains of chronic bilateral knee pain with no swelling.  He has been diagnosed with osteoarthritis and wants a refill of meloxicam.  His wife is being monitored for latent syphilis.  Outpatient Medications Prior to Visit  Medication Sig Dispense Refill  . meloxicam (MOBIC) 15 MG tablet Take 1 tablet daily for knee pain. 20 tablet 0   No facility-administered medications prior to visit.     ROS Review of Systems  Constitutional: Negative.  Negative for chills, fatigue and fever.  HENT: Negative.   Eyes: Negative for visual disturbance.  Respiratory: Negative for cough, chest tightness, shortness of breath and wheezing.   Cardiovascular: Negative for chest pain, palpitations and leg swelling.  Gastrointestinal: Negative for abdominal pain, diarrhea, nausea and vomiting.  Endocrine: Negative.   Genitourinary: Negative.  Negative for difficulty urinating, dysuria and hematuria.  Musculoskeletal: Positive for arthralgias. Negative for back pain and myalgias.  Skin: Positive for rash and wound. Negative for color change and pallor.  Neurological: Negative.  Negative for dizziness, weakness and light-headedness.  Hematological: Negative for adenopathy. Does not bruise/bleed easily.  Psychiatric/Behavioral: Negative.     Objective:  BP 130/80 (BP Location: Left Arm, Patient Position: Sitting, Cuff Size: Large)   Pulse 67   Temp 98.4 F (36.9 C)  (Oral)   Ht 6\' 2"  (1.88 m)   Wt 233 lb (105.7 kg)   SpO2 99%   BMI 29.92 kg/m   BP Readings from Last 3 Encounters:  04/25/18 130/80  02/08/18 (!) 140/98  06/27/17 135/88    Wt Readings from Last 3 Encounters:  04/25/18 233 lb (105.7 kg)  02/08/18 230 lb 9.6 oz (104.6 kg)  05/09/17 234 lb 4 oz (106.3 kg)    Physical Exam  Constitutional: He is oriented to person, place, and time. No distress.  HENT:  Mouth/Throat: Oropharynx is clear and moist. No oropharyngeal exudate.  Eyes: Conjunctivae are normal. No scleral icterus.  Neck: Normal range of motion. Neck supple. No JVD present. No thyromegaly present.  Cardiovascular: Normal rate, regular rhythm and normal heart sounds.  No murmur heard. Pulmonary/Chest: Effort normal and breath sounds normal. No respiratory distress. He has no wheezes. He has no rales.  Abdominal: Soft. Bowel sounds are normal. He exhibits no mass. There is no hepatosplenomegaly. There is no tenderness.  Musculoskeletal: Normal range of motion. He exhibits no edema or tenderness.       Right knee: He exhibits deformity (DJD). He exhibits normal range of motion, no swelling, no effusion, no erythema and no bony tenderness. No tenderness found.       Left knee: He exhibits deformity (DJD). He exhibits normal range of motion, no swelling, no effusion, no erythema and no bony tenderness. No tenderness found.       Legs: Lymphadenopathy:    He has no cervical adenopathy.  Neurological: He is alert and oriented to  person, place, and time.  Skin: Skin is warm and dry. No rash noted. He is not diaphoretic. There is erythema. No pallor.  Vitals reviewed.   Lab Results  Component Value Date   WBC 5.0 04/25/2018   HGB 13.3 04/25/2018   HCT 39.8 04/25/2018   PLT 236.0 04/25/2018   GLUCOSE 99 04/25/2018   CHOL 132 05/09/2017   TRIG 87.0 05/09/2017   HDL 46.30 05/09/2017   LDLCALC 69 05/09/2017   ALT 20 04/25/2018   AST 22 04/25/2018   NA 140 04/25/2018   K  4.1 04/25/2018   CL 106 04/25/2018   CREATININE 1.18 04/25/2018   BUN 13 04/25/2018   CO2 28 04/25/2018   TSH 1.78 05/09/2017   PSA 0.75 05/09/2017    Dg Knee Complete 4 Views Left  Result Date: 02/08/2018 CLINICAL DATA:  Medial knee pain and swelling after fall with twisting injury today. EXAM: LEFT KNEE - COMPLETE 4+ VIEW COMPARISON:  None. FINDINGS: Degenerative changes in the left knee with medial greater than lateral compartment narrowing and tricompartment osteophyte formation. No evidence of acute fracture or dislocation. No focal bone lesion or bone destruction. Bone cortex appears intact. There is heterogeneous medial soft tissue swelling over the left knee likely representing a soft tissue hematoma. No significant effusion. 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 Medial soft tissue swelling and hematoma. No acute bony abnormalities. Electronically Signed   By: Burman Nieves M.D.   On: 02/08/2018 23:53    Assessment & Plan:   [place] was seen today for osteoarthritis and rash.  Diagnoses and all orders for this visit:  Idiopathic gout, unspecified chronicity, unspecified site- His uric acid is low and he is not having gout symptoms so I do not think he needs a xanthine oxidase inhibitor. 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 -     RPR; Future -     HIV antibody (with reflex)  Primary osteoarthritis of both knees- Will continue meloxicam as needed. -     CBC with Differential/Platelet; Future -     Comprehensive metabolic panel; Future -     Sedimentation rate; Future -     meloxicam (MOBIC) 15 MG tablet; Take 1 tablet daily for knee pain.  Bullous impetigo- His labs are negative for any concerns about systemic involvement.  Will treat fro staph and strep tedizolid. -     CBC with Differential/Platelet; Future -     Sedimentation rate; Future -     Tedizolid Phosphate (SIVEXTRO) 200 MG TABS; Take 1 tablet by mouth  daily for 6 days. -     Wound culture; Future  Other orders -     Flu Vaccine QUAD 36+ mos IM   I am having Davari Steeves start on Tedizolid Phosphate. I am also having him maintain his meloxicam.  Meds ordered this encounter  Medications  . Tedizolid Phosphate (SIVEXTRO) 200 MG TABS    Sig: Take 1 tablet by mouth daily for 6 days.    Dispense:  6 tablet    Refill:  0  . meloxicam (MOBIC) 15 MG tablet    Sig: Take 1 tablet daily for knee pain.    Dispense:  90 tablet    Refill:  0     Follow-up: Return in about 2 weeks (around 05/09/2018).  Sanda Linger, MD

## 2018-04-25 NOTE — Patient Instructions (Signed)
Impetigo, Adult Impetigo is an infection of the skin. It commonly occurs in young children, but it can also occur in adults. The infection causes itchy blisters and sores that produce brownish-yellow fluid. As the fluid dries, it forms a thick, honey-colored crust. These skin changes usually occur on the face but can also affect other areas of the body. Impetigo usually goes away in 7-10 days with treatment. What are the causes? Impetigo is caused by two types of bacteria. It may be caused by staphylococci or streptococci bacteria. These bacteria cause impetigo when they get under the surface of the skin. This often happens after some damage to the skin, such as damage from:  Cuts, scrapes, or scratches.  Insect bites, especially when you scratch the area of a bite.  Chickenpox or other illnesses that cause open skin sores.  Nail biting or chewing.  Impetigo is contagious and can spread easily from one person to another. This may occur through close skin contact or by sharing towels, clothing, or other items with a person who has the infection. What increases the risk? Some things that can increase the risk of getting this infection include:  Playing sports that include skin-to-skin contact with others.  Having a skin condition with open sores.  Having many skin cuts or scrapes.  Living in an area that has high humidity levels.  Having poor hygiene.  Having high levels of staphylococci in your nose.  What are the signs or symptoms? Impetigo usually starts out as small blisters, often on the face. The blisters then break open and turn into tiny sores (lesions) with a yellow crust. In some cases, the blisters cause itching or burning. With scratching, irritation, or lack of treatment, these small lesions may get larger. Scratching can also cause impetigo to spread to other parts of the body. The bacteria can get under the fingernails and spread when you touch another area of your  skin. Other possible symptoms include:  Larger blisters.  Pus.  Swollen lymph glands.  How is this diagnosed? This condition is usually diagnosed during a physical exam. A skin sample or sample of fluid from a blister may be taken for lab tests that involve growing bacteria (culture test). This can help confirm the diagnosis or help determine the best treatment. How is this treated? Mild impetigo can be treated with prescription antibiotic cream. Oral antibiotic medicine may be used in more severe cases. Medicines for itching may also be used. Follow these instructions at home:  Take medicines only as directed by your health care provider.  To help prevent impetigo from spreading to other body areas: ? Keep your fingernails short and clean. ? Do not scratch the blisters or sores. ? Cover infected areas, if necessary, to keep from scratching.  Gently wash the infected areas with antibiotic soap and water.  Soak crusted areas in warm, soapy water using antibiotic soap. ? Gently rub the areas to remove crusts. Do not scrub.  Wash your hands often to avoid spreading this infection.  Stay home until you have used an antibiotic cream for 48 hours (2 days) or an oral antibiotic medicine for 24 hours (1 day). You should only return to work and activities with other people if your skin shows significant improvement. How is this prevented? To keep the infection from spreading:  Stay home until you have used an antibiotic cream for 48 hours or an oral antibiotic for 24 hours.  Wash your hands often.  Do not engage in   skin-to-skin contact with other people while you have still have blisters.  Do not share towels, washcloths, or bedding with others while you have the infection.  Contact a health care provider if:  You develop more blisters or sores despite treatment.  Other family members get sores.  Your skin sores are not improving after 48 hours of treatment.  You have a  fever. Get help right away if:  You see spreading redness or swelling of the skin around your sores.  You see red streaks coming from your sores.  You develop a sore throat. This information is not intended to replace advice given to you by your health care provider. Make sure you discuss any questions you have with your health care provider. Document Released: 07/04/2014 Document Revised: 11/19/2015 Document Reviewed: 05/27/2014 Elsevier Interactive Patient Education  2017 [employer]  

## 2018-04-26 LAB — RPR: RPR: NONREACTIVE

## 2018-04-27 LAB — WOUND CULTURE
MICRO NUMBER: 91306021
SPECIMEN QUALITY:: ADEQUATE

## 2018-04-30 ENCOUNTER — Telehealth: Payer: Self-pay | Admitting: Internal Medicine

## 2018-04-30 NOTE — Telephone Encounter (Signed)
I called pharmacy and they received both prescriptions on 10/30 and the patient picked up the meloxicam on 10/31 but the SIVEXTRO was $2705 dollars, please advise on alterative.

## 2018-04-30 NOTE — Telephone Encounter (Signed)
Copied from CRM [id number]. Topic: Quick Communication - Rx Refill/Question >> Apr 30, 2018  8:40 AM Baldo Daub L wrote: Medication:  Pt believes that he was supposed to have a RX called in after his office visit.  Pt doesn't remember the name of the medication, but states there is nothing for him at his pharmacy.  Has the patient contacted their pharmacy? {yes (Agent: If no, request that the patient contact the pharmacy for the refill.) (Agent: If yes, when and what did the pharmacy advise?)  Preferred Pharmacy (with phone number or street name): [facility] [phone] - [place], [place] - [address] [phone] (Phone) [phone] (Fax)  Agent: Please be advised that RX refills may take up to 3 business days. We ask that you follow-up with your pharmacy.

## 2018-05-01 ENCOUNTER — Other Ambulatory Visit: Payer: Self-pay | Admitting: Internal Medicine

## 2018-05-01 DIAGNOSIS — L0103 Bullous impetigo: Secondary | ICD-10-CM

## 2018-05-01 MED ORDER — SULFAMETHOXAZOLE-TRIMETHOPRIM 800-160 MG PO TABS: 1.0000 | ORAL_TABLET | Freq: Two times a day (BID) | ORAL | 0 refills | Status: AC

## 2018-05-01 NOTE — Telephone Encounter (Signed)
Key: [id number]

## 2018-05-01 NOTE — Telephone Encounter (Signed)
Pt is not able to afford the sivextro. I can do a PA to try to reduce the cost.

## 2018-05-01 NOTE — Telephone Encounter (Signed)
Rx for generic bactrim sent

## 2018-05-21 ENCOUNTER — Other Ambulatory Visit: Payer: Self-pay | Admitting: Internal Medicine

## 2018-05-21 DIAGNOSIS — L0103 Bullous impetigo: Secondary | ICD-10-CM

## 2018-07-28 ENCOUNTER — Other Ambulatory Visit: Payer: Self-pay | Admitting: Internal Medicine

## 2018-07-28 DIAGNOSIS — M170 Bilateral primary osteoarthritis of knee: Secondary | ICD-10-CM

## 2019-03-19 ENCOUNTER — Ambulatory Visit: Payer: 59 | Admitting: Internal Medicine

## 2019-03-26 ENCOUNTER — Ambulatory Visit (INDEPENDENT_AMBULATORY_CARE_PROVIDER_SITE_OTHER): Payer: Self-pay | Admitting: Internal Medicine

## 2019-03-26 ENCOUNTER — Other Ambulatory Visit: Payer: Self-pay

## 2019-03-26 ENCOUNTER — Encounter: Payer: Self-pay | Admitting: Internal Medicine

## 2019-03-26 ENCOUNTER — Other Ambulatory Visit (INDEPENDENT_AMBULATORY_CARE_PROVIDER_SITE_OTHER): Payer: Self-pay

## 2019-03-26 DIAGNOSIS — G43711 Chronic migraine without aura, intractable, with status migrainosus: Secondary | ICD-10-CM

## 2019-03-26 DIAGNOSIS — Z202 Contact with and (suspected) exposure to infections with a predominantly sexual mode of transmission: Secondary | ICD-10-CM

## 2019-03-26 DIAGNOSIS — R51 Headache: Secondary | ICD-10-CM

## 2019-03-26 DIAGNOSIS — I10 Essential (primary) hypertension: Secondary | ICD-10-CM

## 2019-03-26 DIAGNOSIS — G8929 Other chronic pain: Secondary | ICD-10-CM

## 2019-03-26 DIAGNOSIS — Z23 Encounter for immunization: Secondary | ICD-10-CM

## 2019-03-26 DIAGNOSIS — G4452 New daily persistent headache (NDPH): Secondary | ICD-10-CM

## 2019-03-26 LAB — CBC WITH DIFFERENTIAL/PLATELET
Basophils Absolute: 0 10*3/uL (ref 0.0–0.1)
Basophils Relative: 0.7 % (ref 0.0–3.0)
Eosinophils Absolute: 0.2 10*3/uL (ref 0.0–0.7)
Eosinophils Relative: 2.6 % (ref 0.0–5.0)
HCT: 43.9 % (ref 39.0–52.0)
Hemoglobin: 14.5 g/dL (ref 13.0–17.0)
Lymphocytes Relative: 41 % (ref 12.0–46.0)
Lymphs Abs: 2.5 10*3/uL (ref 0.7–4.0)
MCHC: 32.9 g/dL (ref 30.0–36.0)
MCV: 80.6 fl (ref 78.0–100.0)
Monocytes Absolute: 0.4 10*3/uL (ref 0.1–1.0)
Monocytes Relative: 6.7 % (ref 3.0–12.0)
Neutro Abs: 3 10*3/uL (ref 1.4–7.7)
Neutrophils Relative %: 49 % (ref 43.0–77.0)
Platelets: 240 10*3/uL (ref 150.0–400.0)
RBC: 5.45 Mil/uL (ref 4.22–5.81)
RDW: 13.1 % (ref 11.5–15.5)
WBC: 6.1 10*3/uL (ref 4.0–10.5)

## 2019-03-26 LAB — SEDIMENTATION RATE: Sed Rate: 11 mm/hr (ref 0–20)

## 2019-03-26 LAB — BASIC METABOLIC PANEL
BUN: 12 mg/dL (ref 6–23)
CO2: 26 mEq/L (ref 19–32)
Calcium: 9.7 mg/dL (ref 8.4–10.5)
Chloride: 105 mEq/L (ref 96–112)
Creatinine, Ser: 1.05 mg/dL (ref 0.40–1.50)
GFR: 90.19 mL/min (ref >60.00)
Glucose, Bld: 101 mg/dL — ABNORMAL HIGH (ref 70–99)
Potassium: 3.8 mEq/L (ref 3.5–5.1)
Sodium: 138 mEq/L (ref 135–145)

## 2019-03-26 LAB — HEPATIC FUNCTION PANEL
ALT: 32 U/L (ref 0–53)
AST: 35 U/L (ref 0–37)
Albumin: 4.4 g/dL (ref 3.5–5.2)
Alkaline Phosphatase: 82 U/L (ref 39–117)
Bilirubin, Direct: 0.1 mg/dL (ref 0.0–0.3)
Total Bilirubin: 0.4 mg/dL (ref 0.2–1.2)
Total Protein: 7.3 g/dL (ref 6.0–8.3)

## 2019-03-26 LAB — TSH: TSH: 0.88 u[IU]/mL (ref 0.35–4.50)

## 2019-03-26 MED ORDER — NURTEC 75 MG PO TBDP: 1.0000 | ORAL_TABLET | Freq: Every day | ORAL | 0 refills | Status: DC | PRN

## 2019-03-26 NOTE — Patient Instructions (Signed)
General Headache Without Cause °A headache is pain or discomfort that is felt around the head or neck area. There are many causes and types of headaches. In some cases, the cause may not be found. °Follow these instructions at home: °Watch your condition for any changes. Let your doctor know about them. Take these steps to help with your condition: °Managing pain ° °  ° °· Take over-the-counter and prescription medicines only as told by your doctor. °· Lie down in a dark, quiet room when you have a headache. °· If told, put ice on your head and neck area: °? Put ice in a plastic bag. °? Place a towel between your skin and the bag. °? Leave the ice on for 20 minutes, 2-3 times per day. °· If told, put heat on the affected area. Use the heat source that your doctor recommends, such as a moist heat pack or a heating pad. °? Place a towel between your skin and the heat source. °? Leave the heat on for 20-30 minutes. °? Remove the heat if your skin turns bright red. This is very important if you are unable to feel pain, heat, or cold. You may have a greater risk of getting burned. °· Keep lights dim if bright lights bother you or make your headaches worse. °Eating and drinking °· Eat meals on a regular schedule. °· If you drink alcohol: °? Limit how much you use to: °§ 0-1 drink a day for women. °§ 0-2 drinks a day for men. °? Be aware of how much alcohol is in your drink. In the [place] one drink equals one 12 oz bottle of beer (355 mL), one 5 oz glass of wine (148 mL), or one 1½ oz glass of hard liquor (44 mL). °· Stop drinking caffeine, or reduce how much caffeine you drink. °General instructions ° °· Keep a journal to find out if certain things bring on headaches. For example, write down: °? What you eat and drink. °? How much sleep you get. °? Any change to your diet or medicines. °· Get a massage or try other ways to relax. °· Limit stress. °· Sit up straight. Do not tighten (tense) your muscles. °· Do not use any  products that contain nicotine or tobacco. This includes cigarettes, e-cigarettes, and chewing tobacco. If you need help quitting, ask your doctor. °· Exercise regularly as told by your doctor. °· Get enough sleep. This often means 7-9 hours of sleep each night. °· Keep all follow-up visits as told by your doctor. This is important. °Contact a doctor if: °· Your symptoms are not helped by medicine. °· You have a headache that feels different than the other headaches. °· You feel sick to your stomach (nauseous) or you throw up (vomit). °· You have a fever. °Get help right away if: °· Your headache gets very bad quickly. °· Your headache gets worse after a lot of physical activity. °· You keep throwing up. °· You have a stiff neck. °· You have trouble seeing. °· You have trouble speaking. °· You have pain in the eye or ear. °· Your muscles are weak or you lose muscle control. °· You lose your balance or have trouble walking. °· You feel like you will pass out (faint) or you pass out. °· You are mixed up (confused). °· You have a seizure. °Summary °· A headache is pain or discomfort that is felt around the head or neck area. °· There are many causes and   types of headaches. In some cases, the cause may not be found. °· Keep a journal to help find out what causes your headaches. Watch your condition for any changes. Let your doctor know about them. °· Contact a doctor if you have a headache that is different from usual, or if your headache is not helped by medicine. °· Get help right away if your headache gets very bad, you throw up, you have trouble seeing, you lose your balance, or you have a seizure. °This information is not intended to replace advice given to you by your health care provider. Make sure you discuss any questions you have with your health care provider. °Document Released: 03/22/2008 Document Revised: 01/01/2018 Document Reviewed: 01/01/2018 °Elsevier Patient Education © 2020 Elsevier Inc. ° °

## 2019-03-26 NOTE — Progress Notes (Signed)
Subjective:  Patient ID: Bradley Daniels, male    DOB: [birth year]  Age: 48 y.o. MRN: [id number]  CC: Hypertension and Headache   HPI Bradley Daniels presents for concerns about a 3-week history of headache.  It is difficult to get a history from him due to the language barrier.  He says he has had a headache like this before but not one that has lasted this long.  He describes an intermittent pounding sensation in the frontotemporal area bilaterally.  He complains of dizziness and vertigo when he is laying down and changes his head position or gets up.  He has not taken anything to control the symptoms.  He denies nausea, vomiting, visual disturbance, paresthesias, slurred speech, or ataxia.  His wife has latent syphilis.  Outpatient Medications Prior to Visit  Medication Sig Dispense Refill   meloxicam (MOBIC) 15 MG tablet TAKE 1 TABLET BY MOUTH DAILY FOR KNEE PAIN 90 tablet 0   No facility-administered medications prior to visit.     ROS Review of Systems  Constitutional: Negative for chills and fever.  HENT: Negative.  Negative for sore throat and trouble swallowing.   Eyes: Negative.   Respiratory: Negative for cough, chest tightness, shortness of breath and wheezing.   Cardiovascular: Negative for chest pain, palpitations and leg swelling.  Gastrointestinal: Negative for abdominal pain, diarrhea, nausea and vomiting.  Endocrine: Negative.   Genitourinary: Negative.  Negative for difficulty urinating and genital sores.  Musculoskeletal: Negative for back pain and neck pain.  Skin: Negative.  Negative for rash.  Neurological: Positive for dizziness and headaches. Negative for tremors, seizures, syncope, facial asymmetry, speech difficulty, weakness, light-headedness and numbness.  Hematological: Negative for adenopathy. Does not bruise/bleed easily.  Psychiatric/Behavioral: Negative.     Objective:  BP 138/88 (BP Location: Left Arm, Patient Position: Sitting, Cuff Size: Normal)     Pulse 73    Temp 98.3 F (36.8 C) (Oral)    Ht 6\' 2"  (1.88 m)    Wt 237 lb (107.5 kg)    SpO2 98%    BMI 30.43 kg/m   BP Readings from Last 3 Encounters:  03/26/19 138/88  04/25/18 130/80  02/08/18 (!) 140/98    Wt Readings from Last 3 Encounters:  03/26/19 237 lb (107.5 kg)  04/25/18 233 lb (105.7 kg)  02/08/18 230 lb 9.6 oz (104.6 kg)    Physical Exam Vitals signs reviewed.  Constitutional:      General: He is not in acute distress.    Appearance: He is well-developed. He is not ill-appearing, toxic-appearing or diaphoretic.  HENT:     Nose: Nose normal.     Mouth/Throat:     Mouth: Mucous membranes are moist.  Eyes:     General: No scleral icterus.    Extraocular Movements: Extraocular movements intact.     Conjunctiva/sclera: Conjunctivae normal.     Pupils: Pupils are equal, round, and reactive to light.  Neck:     Musculoskeletal: Normal range of motion. No neck rigidity.  Cardiovascular:     Rate and Rhythm: Normal rate and regular rhythm.     Heart sounds: No murmur.  Pulmonary:     Effort: Pulmonary effort is normal.     Breath sounds: No stridor. No wheezing, rhonchi or rales.  Abdominal:     General: Abdomen is flat. Bowel sounds are normal. There is no distension.     Palpations: Abdomen is soft. There is no hepatomegaly, splenomegaly or mass.     Tenderness: There  is no abdominal tenderness.  Musculoskeletal: Normal range of motion.     Right lower leg: No edema.     Left lower leg: No edema.  Lymphadenopathy:     Cervical: No cervical adenopathy.  Skin:    General: Skin is warm and dry.     Findings: No rash.  Neurological:     General: No focal deficit present.     Mental Status: He is alert.     Cranial Nerves: Cranial nerves are intact.     Sensory: Sensation is intact.     Motor: Motor function is intact. No weakness.     Coordination: Coordination is intact. Romberg sign negative. Coordination normal. Finger-Nose-Finger Test normal.      Gait: Gait is intact. Gait and tandem walk normal.     Deep Tendon Reflexes: Reflexes normal. Babinski sign absent on the right side. Babinski sign absent on the left side.     Reflex Scores:      Tricep reflexes are 0 on the right side and 0 on the left side.      Bicep reflexes are 0 on the right side and 0 on the left side.      Brachioradialis reflexes are 0 on the right side and 0 on the left side.      Patellar reflexes are 0 on the right side and 0 on the left side.      Achilles reflexes are 0 on the right side and 0 on the left side.    Lab Results  Component Value Date   WBC 6.1 03/26/2019   HGB 14.5 03/26/2019   HCT 43.9 03/26/2019   PLT 240.0 03/26/2019   GLUCOSE 101 (H) 03/26/2019   CHOL 132 05/09/2017   TRIG 87.0 05/09/2017   HDL 46.30 05/09/2017   LDLCALC 69 05/09/2017   ALT 32 03/26/2019   AST 35 03/26/2019   NA 138 03/26/2019   K 3.8 03/26/2019   CL 105 03/26/2019   CREATININE 1.05 03/26/2019   BUN 12 03/26/2019   CO2 26 03/26/2019   TSH 0.88 03/26/2019   PSA 0.75 05/09/2017    Dg Knee Complete 4 Views Left  Result Date: 02/08/2018 CLINICAL DATA:  Medial knee pain and swelling after fall with twisting injury today. EXAM: LEFT KNEE - COMPLETE 4+ VIEW COMPARISON:  None. FINDINGS: Degenerative changes in the left knee with medial greater than lateral compartment narrowing and tricompartment osteophyte formation. No evidence of acute fracture or dislocation. No focal bone lesion or bone destruction. Bone cortex appears intact. There is heterogeneous medial soft tissue swelling over the left knee likely representing a soft tissue hematoma. No significant effusion. 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 Medial soft tissue swelling and hematoma. No acute bony abnormalities. Electronically Signed   By: Burman NievesWilliam  Stevens M.D.   On: 02/08/2018 23:53    Assessment & Plan:   Bradley Daniels was seen today for hypertension and headache.  Diagnoses and  all orders for this visit:  Need for influenza vaccination -     Flu Vaccine QUAD 36+ mos IM  Chronic intractable headache, unspecified headache type- He has new onset headache at the age of 48.  I have asked him to undergo an MRI of the brain to screen for tumor, aneurysm, bleed, NPH, or CVA.  His exam and labs are reassuring. -     CBC with Differential/Platelet; Future -     Sedimentation rate; Future -     Hepatic function panel;  Future -     MR Brain Wo Contrast; Future  Essential hypertension- His blood pressure is adequately well controlled with lifestyle modifications. -     CBC with Differential/Platelet; Future -     Basic metabolic panel; Future -     TSH; Future -     Urinalysis, Routine w reflex microscopic; Future  Intractable chronic migraine without aura and with status migrainosus- His headache is suspicious for migraine.  I recommended that he try 1 of the new CGRP antagonist. -     Rimegepant Sulfate (NURTEC) 75 MG TBDP; Take 1 tablet by mouth daily as needed.  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 -     RPR; Future  New daily persistent headache -     MR Brain Wo Contrast; Future   I am having Bradley Daniels start on Nurtec. I am also having him maintain his meloxicam.  Meds ordered this encounter  Medications   Rimegepant Sulfate (NURTEC) 75 MG TBDP    Sig: Take 1 tablet by mouth daily as needed.    Dispense:  4 tablet    Refill:  0     Follow-up: Return in about 3 weeks (around 04/16/2019).  Scarlette Calico, MD

## 2019-03-27 LAB — URINALYSIS, ROUTINE W REFLEX MICROSCOPIC
Bilirubin Urine: NEGATIVE
Hgb urine dipstick: NEGATIVE
Ketones, ur: NEGATIVE
Leukocytes,Ua: NEGATIVE
Nitrite: NEGATIVE
RBC / HPF: NONE SEEN (ref 0–?)
Specific Gravity, Urine: 1.025 (ref 1.000–1.030)
Total Protein, Urine: NEGATIVE
Urine Glucose: NEGATIVE
Urobilinogen, UA: 0.2 (ref 0.0–1.0)
pH: 6 (ref 5.0–8.0)

## 2019-03-27 LAB — RPR: RPR Ser Ql: NONREACTIVE

## 2019-04-16 ENCOUNTER — Encounter: Payer: Self-pay | Admitting: Internal Medicine

## 2019-05-11 ENCOUNTER — Other Ambulatory Visit: Payer: Self-pay

## 2019-05-11 ENCOUNTER — Ambulatory Visit
Admission: RE | Admit: 2019-05-11 | Discharge: 2019-05-11 | Disposition: A | Discharge status: Home / Self Care | Payer: Self-pay | Source: Ambulatory Visit | Attending: Internal Medicine | Admitting: Internal Medicine

## 2019-05-11 DIAGNOSIS — G8929 Other chronic pain: Secondary | ICD-10-CM

## 2019-05-11 DIAGNOSIS — G4452 New daily persistent headache (NDPH): Secondary | ICD-10-CM

## 2019-05-15 ENCOUNTER — Other Ambulatory Visit: Payer: Self-pay

## 2019-05-15 DIAGNOSIS — Z20822 Contact with and (suspected) exposure to covid-19: Secondary | ICD-10-CM

## 2019-05-17 ENCOUNTER — Telehealth: Payer: Self-pay | Admitting: Internal Medicine

## 2019-05-17 LAB — NOVEL CORONAVIRUS, NAA: SARS-CoV-2, NAA: NOT DETECTED

## 2019-05-17 NOTE — Telephone Encounter (Signed)
Patient is calling back the script for bactrim was not received by the pharmacy.   [facility] [phone]

## 2019-05-17 NOTE — Telephone Encounter (Signed)
Patient called back requesting results are faxed to his employer at [phone]  cb # [phone]

## 2019-05-17 NOTE — Telephone Encounter (Signed)
Patient is calling from negative COVID results.Patient expressed understanding.

## 2019-05-20 ENCOUNTER — Telehealth: Payer: Self-pay | Admitting: Internal Medicine

## 2019-05-20 NOTE — Telephone Encounter (Signed)
Can you look at this again? I do not see where bactrim was sent in.

## 2019-05-20 NOTE — Telephone Encounter (Signed)
Copied from [place] [id number]. Topic: General - Other >> May 20, 2019  9:32 AM Keene Breath wrote: Reason for CRM: Patient called to ask the nurse or doctor to call him regarding some medication that he is supposed to have gotten for his headaches.  Please call to discuss at [phone]

## 2019-05-21 NOTE — Telephone Encounter (Signed)
Patient is calling again because he still has not heard from the nurse or doctor regarding his headache medication.  He stated that this started over a week ago.  Please advise and call patient back.

## 2019-05-21 NOTE — Telephone Encounter (Signed)
There is already a note started and it has been sent to PCP for review and advise. Closing this note.

## 2019-07-25 ENCOUNTER — Encounter: Payer: Self-pay | Admitting: Internal Medicine

## 2019-07-25 ENCOUNTER — Ambulatory Visit: Payer: 59 | Admitting: Internal Medicine

## 2019-07-25 ENCOUNTER — Other Ambulatory Visit: Payer: Self-pay

## 2019-07-25 VITALS — BP 130/80 | HR 70 | Temp 98.4°F | Ht 74.0 in | Wt 236.0 lb

## 2019-07-25 DIAGNOSIS — M1000 Idiopathic gout, unspecified site: Secondary | ICD-10-CM

## 2019-07-25 DIAGNOSIS — Z1211 Encounter for screening for malignant neoplasm of colon: Secondary | ICD-10-CM | POA: Insufficient documentation

## 2019-07-25 DIAGNOSIS — M25561 Pain in right knee: Secondary | ICD-10-CM | POA: Insufficient documentation

## 2019-07-25 DIAGNOSIS — Z0000 Encounter for general adult medical examination without abnormal findings: Secondary | ICD-10-CM | POA: Diagnosis not present

## 2019-07-25 DIAGNOSIS — M10061 Idiopathic gout, right knee: Secondary | ICD-10-CM | POA: Insufficient documentation

## 2019-07-25 DIAGNOSIS — G43711 Chronic migraine without aura, intractable, with status migrainosus: Secondary | ICD-10-CM

## 2019-07-25 DIAGNOSIS — I10 Essential (primary) hypertension: Secondary | ICD-10-CM | POA: Diagnosis not present

## 2019-07-25 DIAGNOSIS — Z202 Contact with and (suspected) exposure to infections with a predominantly sexual mode of transmission: Secondary | ICD-10-CM | POA: Diagnosis not present

## 2019-07-25 DIAGNOSIS — M170 Bilateral primary osteoarthritis of knee: Secondary | ICD-10-CM

## 2019-07-25 MED ORDER — METHYLPREDNISOLONE 4 MG PO TBPK: ORAL_TABLET | ORAL | 0 refills | Status: AC

## 2019-07-25 MED ORDER — COLCHICINE 0.6 MG PO CAPS: 1.0000 | ORAL_CAPSULE | Freq: Two times a day (BID) | ORAL | 5 refills | Status: DC

## 2019-07-25 MED ORDER — NURTEC 75 MG PO TBDP: 1.0000 | ORAL_TABLET | Freq: Every day | ORAL | 5 refills | Status: DC | PRN

## 2019-07-25 MED ORDER — MELOXICAM 15 MG PO TABS: ORAL_TABLET | ORAL | 0 refills | Status: DC

## 2019-07-25 NOTE — Patient Instructions (Signed)
Gout  Gout is a condition that causes painful swelling of the joints. Gout is a type of inflammation of the joints (arthritis). This condition is caused by having too much uric acid in the body. Uric acid is a chemical that forms when the body breaks down substances called purines. Purines are important for building body proteins. When the body has too much uric acid, sharp crystals can form and build up inside the joints. This causes pain and swelling. Gout attacks can happen quickly and may be very painful (acute gout). Over time, the attacks can affect more joints and become more frequent (chronic gout). Gout can also cause uric acid to build up under the skin and inside the kidneys. What are the causes? This condition is caused by too much uric acid in your blood. This can happen because:  Your kidneys do not remove enough uric acid from your blood. This is the most common cause.  Your body makes too much uric acid. This can happen with some cancers and cancer treatments. It can also occur if your body is breaking down too many red blood cells (hemolytic anemia).  You eat too many foods that are high in purines. These foods include organ meats and some seafood. Alcohol, especially beer, is also high in purines. A gout attack may be triggered by trauma or stress. What increases the risk? You are more likely to develop this condition if you:  Have a family history of gout.  Are male and middle-aged.  Are male and have gone through menopause.  Are obese.  Frequently drink alcohol, especially beer.  Are dehydrated.  Lose weight too quickly.  Have an organ transplant.  Have lead poisoning.  Take certain medicines, including aspirin, cyclosporine, diuretics, levodopa, and niacin.  Have kidney disease.  Have a skin condition called psoriasis. What are the signs or symptoms? An attack of acute gout happens quickly. It usually occurs in just one joint. The most common place is  the big toe. Attacks often start at night. Other joints that may be affected include joints of the feet, ankle, knee, fingers, wrist, or elbow. Symptoms of this condition may include:  Severe pain.  Warmth.  Swelling.  Stiffness.  Tenderness. The affected joint may be very painful to touch.  Shiny, red, or purple skin.  Chills and fever. Chronic gout may cause symptoms more frequently. More joints may be involved. You may also have white or yellow lumps (tophi) on your hands or feet or in other areas near your joints. How is this diagnosed? This condition is diagnosed based on your symptoms, medical history, and physical exam. You may have tests, such as:  Blood tests to measure uric acid levels.  Removal of joint fluid with a thin needle (aspiration) to look for uric acid crystals.  X-rays to look for joint damage. How is this treated? Treatment for this condition has two phases: treating an acute attack and preventing future attacks. Acute gout treatment may include medicines to reduce pain and swelling, including:  NSAIDs.  Steroids. These are strong anti-inflammatory medicines that can be taken by mouth (orally) or injected into a joint.  Colchicine. This medicine relieves pain and swelling when it is taken soon after an attack. It can be given by mouth or through an IV. Preventive treatment may include:  Daily use of smaller doses of NSAIDs or colchicine.  Use of a medicine that reduces uric acid levels in your blood.  Changes to your diet. You may   need to see a dietitian about what to eat and drink to prevent gout. Follow these instructions at home: During a gout attack   If directed, put ice on the affected area: ? Put ice in a plastic bag. ? Place a towel between your skin and the bag. ? Leave the ice on for 20 minutes, 2-3 times a day.  Raise (elevate) the affected joint above the level of your heart as often as possible.  Rest the joint as much as possible.  If the affected joint is in your leg, you may be given crutches to use.  Follow instructions from your health care provider about eating or drinking restrictions. Avoiding future gout attacks  Follow a low-purine diet as told by your dietitian or health care provider. Avoid foods and drinks that are high in purines, including liver, kidney, anchovies, asparagus, herring, mushrooms, mussels, and beer.  Maintain a healthy weight or lose weight if you are overweight. If you want to lose weight, talk with your health care provider. It is important that you do not lose weight too quickly.  Start or maintain an exercise program as told by your health care provider. Eating and drinking  Drink enough fluids to keep your urine pale yellow.  If you drink alcohol: ? Limit how much you use to:  0-1 drink a day for women.  0-2 drinks a day for men. ? Be aware of how much alcohol is in your drink. In the [place] one drink equals one 12 oz bottle of beer (355 mL) one 5 oz glass of wine (148 mL), or one 1 oz glass of hard liquor (44 mL). General instructions  Take over-the-counter and prescription medicines only as told by your health care provider.  Do not drive or use heavy machinery while taking prescription pain medicine.  Return to your normal activities as told by your health care provider. Ask your health care provider what activities are safe for you.  Keep all follow-up visits as told by your health care provider. This is important. Contact a health care provider if you have:  Another gout attack.  Continuing symptoms of a gout attack after 10 days of treatment.  Side effects from your medicines.  Chills or a fever.  Burning pain when you urinate.  Pain in your lower back or belly. Get help right away if you:  Have severe or uncontrolled pain.  Cannot urinate. Summary  Gout is painful swelling of the joints caused by inflammation.  The most common site of pain is the big  toe, but it can affect other joints in the body.  Medicines and dietary changes can help to prevent and treat gout attacks. This information is not intended to replace advice given to you by your health care provider. Make sure you discuss any questions you have with your health care provider. Document Revised: 01/03/2018 Document Reviewed: 01/03/2018 [employer] Patient Education  2020 [employer]  

## 2019-07-25 NOTE — Progress Notes (Signed)
Subjective:  Patient ID: Bradley Daniels, male    DOB: 16-Jun-1971  Age: 49 y.o. MRN: [id number]  CC: Hypertension and Annual Exam  This visit occurred during the SARS-CoV-2 public health emergency.  Safety protocols were in place, including screening questions prior to the visit, additional usage of staff PPE, and extensive cleaning of exam room while observing appropriate contact time as indicated for disinfecting solutions.   HPI Bradley Daniels presents for a CPX.  He requests a refill of Nurtec for migraine headaches.  He says his last headache was a few weeks ago and it responded well to the Nurtec.  He complains of a several week history of worsening, nontraumatic pain and swelling in his right knee.  He describes having fluid drained from the knee about 5 years ago and he thought he was told that it was positive for gout crystals.  The knee feels swollen but not red.  He denies fever or chills.  He also says his left knee bothers him but not quite as much.  He says none of his other joints bother him.  Outpatient Medications Prior to Visit  Medication Sig Dispense Refill  . meloxicam (MOBIC) 15 MG tablet TAKE 1 TABLET BY MOUTH DAILY FOR KNEE PAIN 90 tablet 0  . Rimegepant Sulfate (NURTEC) 75 MG TBDP Take 1 tablet by mouth daily as needed. 4 tablet 0   No facility-administered medications prior to visit.    ROS Review of Systems  Constitutional: Negative for chills, fatigue and fever.  HENT: Negative.   Eyes: Negative for visual disturbance.  Respiratory: Negative for cough, chest tightness, shortness of breath and wheezing.   Cardiovascular: Negative for chest pain, palpitations and leg swelling.  Gastrointestinal: Negative for abdominal pain, blood in stool, constipation, diarrhea, nausea and vomiting.  Endocrine: Negative.   Genitourinary: Negative.  Negative for difficulty urinating, discharge, genital sores, hematuria, scrotal swelling and testicular pain.  Musculoskeletal:  Positive for arthralgias. Negative for back pain and myalgias.  Skin: Negative.  Negative for pallor and rash.  Neurological: Negative.  Negative for dizziness, weakness, light-headedness, numbness and headaches.  Hematological: Negative.  Negative for adenopathy.  Psychiatric/Behavioral: Negative.     Objective:  BP 130/80 (BP Location: Left Arm, Patient Position: Sitting, Cuff Size: Large)   Pulse 70   Temp 98.4 F (36.9 C) (Oral)   Ht 6\' 2"  (1.88 m)   Wt 236 lb (107 kg)   SpO2 97%   BMI 30.30 kg/m   BP Readings from Last 3 Encounters:  07/25/19 130/80  03/26/19 138/88  04/25/18 130/80    Wt Readings from Last 3 Encounters:  07/25/19 236 lb (107 kg)  03/26/19 237 lb (107.5 kg)  04/25/18 233 lb (105.7 kg)    Physical Exam Vitals reviewed.  Constitutional:      Appearance: He is obese. He is not ill-appearing or toxic-appearing.  HENT:     Nose: Nose normal.     Mouth/Throat:     Mouth: Mucous membranes are moist.  Eyes:     General: No scleral icterus.    Conjunctiva/sclera: Conjunctivae normal.  Cardiovascular:     Rate and Rhythm: Normal rate and regular rhythm.     Heart sounds: No murmur.  Pulmonary:     Effort: Pulmonary effort is normal.     Breath sounds: No wheezing, rhonchi or rales.  Abdominal:     General: Abdomen is flat. Bowel sounds are normal. There is no distension.     Palpations: Abdomen is  soft. There is no hepatomegaly, splenomegaly or mass.     Tenderness: There is no abdominal tenderness. There is no guarding.     Hernia: No hernia is present. There is no hernia in the left inguinal area or right inguinal area.  Genitourinary:    Pubic Area: No rash.      Penis: Normal and circumcised. No discharge, swelling or lesions.      Testes: Normal.        Right: Mass, tenderness or swelling not present.        Left: Mass, tenderness or swelling not present.     Epididymis:     Right: Normal. Not enlarged. No mass.     Left: Normal. Not  enlarged. No mass.     Prostate: Normal. Not enlarged, not tender and no nodules present.     Rectum: Normal. Guaiac result negative. No mass, tenderness, anal fissure, external hemorrhoid or internal hemorrhoid. Normal anal tone.  Musculoskeletal:     Cervical back: Neck supple.     Right knee: Swelling and deformity (DJD) present. No effusion, erythema or bony tenderness. Decreased range of motion. No tenderness.     Left knee: Deformity (DJD) present. No swelling, effusion, erythema or bony tenderness. Normal range of motion. No tenderness.     Right lower leg: No edema.     Left lower leg: No edema.  Lymphadenopathy:     Cervical: No cervical adenopathy.     Lower Body: No right inguinal adenopathy. No left inguinal adenopathy.  Skin:    General: Skin is warm and dry.     Findings: No rash.  Neurological:     General: No focal deficit present.     Mental Status: He is alert.  Psychiatric:        Mood and Affect: Mood normal.        Behavior: Behavior normal.     Lab Results  Component Value Date   WBC 6.1 03/26/2019   HGB 14.5 03/26/2019   HCT 43.9 03/26/2019   PLT 240.0 03/26/2019   GLUCOSE 101 (H) 03/26/2019   CHOL 140 07/26/2019   TRIG 54.0 07/26/2019   HDL 54.70 07/26/2019   LDLCALC 74 07/26/2019   ALT 32 03/26/2019   AST 35 03/26/2019   NA 138 03/26/2019   K 3.8 03/26/2019   CL 105 03/26/2019   CREATININE 1.05 03/26/2019   BUN 12 03/26/2019   CO2 26 03/26/2019   TSH 0.88 03/26/2019   PSA 0.51 07/26/2019    MR Brain Wo Contrast  Result Date: 05/12/2019 CLINICAL DATA:  Initial evaluation for chronic headaches, dizziness. EXAM: MRI HEAD WITHOUT CONTRAST TECHNIQUE: Multiplanar, multiecho pulse sequences of the brain and surrounding structures were obtained without intravenous contrast. COMPARISON:  None. FINDINGS: Brain: Cerebral volume within normal limits for patient age. Few scattered subcentimeter foci of T2/FLAIR hyperintensity noted involving the  periventricular and subcortical white matter both cerebral hemispheres, nonspecific. No abnormal foci of restricted diffusion to suggest acute or subacute ischemia. Gray-white matter differentiation well maintained. No encephalomalacia to suggest chronic infarction. No foci of susceptibility artifact to suggest acute or chronic intracranial hemorrhage. No mass lesion, midline shift or mass effect. No hydrocephalus. No extra-axial fluid collection. Major dural sinuses are grossly patent. Incidental note made of a partially empty sella. Suprasellar region normal. Midline structures intact and normal. Vascular: Major intracranial vascular flow voids well maintained and normal in appearance. Skull and upper cervical spine: Craniocervical junction normal. Visualized upper cervical spine within normal  limits. Bone marrow signal intensity normal. No scalp soft tissue abnormality. Sinuses/Orbits: Globes and orbital soft tissues within normal limits. Paranasal sinuses are clear. No mastoid effusion. Inner ear structures normal. Other: None. IMPRESSION: 1. Few scattered foci of subcentimeter T2/FLAIR hyperintensities involving the supratentorial cerebral white matter. Findings are nonspecific, with primary differential considerations consisting of changes related to mild chronic small vessel ischemic disease, migrainous disorder, or other prior infectious or inflammatory disorder. 2. Otherwise unremarkable and normal brain MRI for age. Electronically Signed   By: Rise Mu M.D.   On: 05/12/2019 21:19    DG Knee Complete 4 Views Right  Result Date: 07/26/2019 CLINICAL DATA:  Knee pain for 1 week EXAM: RIGHT KNEE - COMPLETE 4+ VIEW COMPARISON:  09/20/2014 FINDINGS: No fracture or malalignment. Mild lateral joint space degenerative change with moderate severe degenerative changes of the medial joint space. Mild patellofemoral degenerative change. No large knee effusion. IMPRESSION: 1. Tricompartment arthritis of  the knee, worst involving the medial compartment, findings progressed since 2016 2. No acute osseous abnormality Electronically Signed   By: Jasmine Pang M.D.   On: 07/26/2019 20:38    Assessment & Plan:   Kiril was seen today for hypertension and annual exam.  Diagnoses and all orders for this visit:  Essential hypertension- His blood pressure is adequately well controlled.  Medical therapy is not indicated.  Routine general medical examination at a health care facility- Exam completed, labs reviewed, vaccines reviewed, Cologuard ordered to screen for colon cancer/polyps, patient education was given. -     Lipid panel -     PSA  Intractable chronic migraine without aura and with status migrainosus -     Rimegepant Sulfate (NURTEC) 75 MG TBDP; Take 1 tablet by mouth daily as needed.  Exposure to syphilis- His wife has latent syphilis.  I will monitor his RPR. -     RPR  Idiopathic gout, unspecified chronicity, unspecified site- His uric acid level is low.  I am not certain that his knee pain is related to gouty arthropathy.  Will empirically try course of colchicine.  I do not recommend that he take a xanthine oxidase inhibitor. -     Uric acid -     Colchicine (MITIGARE) 0.6 MG CAPS; Take 1 tablet by mouth 2 (two) times daily.  Primary osteoarthritis of both knees- Based on his symptoms, exam, and plain films this looks severe and with a recent exacerbation..  I have asked him to try meloxicam and methylprednisolone to control the pain and swelling.  I have recommended that see orthopedics to see if he needs to undergo total knee replacement. -     meloxicam (MOBIC) 15 MG tablet; TAKE 1 TABLET BY MOUTH DAILY FOR KNEE PAIN -     Ambulatory referral to Orthopedic Surgery  Colon cancer screening -     Cologuard  Acute pain of right knee- See above -     DG Knee Complete 4 Views Right; Future  Acute idiopathic gout of right knee- See above -     methylPREDNISolone (MEDROL  DOSEPAK) 4 MG TBPK tablet; TAKE AS DIRECTED -     Colchicine (MITIGARE) 0.6 MG CAPS; Take 1 tablet by mouth 2 (two) times daily.   I am having Salaam Grewell start on methylPREDNISolone and Colchicine. I am also having him maintain his Nurtec and meloxicam.  Meds ordered this encounter  Medications  . Rimegepant Sulfate (NURTEC) 75 MG TBDP    Sig: Take 1 tablet by  mouth daily as needed.    Dispense:  8 tablet    Refill:  5  . meloxicam (MOBIC) 15 MG tablet    Sig: TAKE 1 TABLET BY MOUTH DAILY FOR KNEE PAIN    Dispense:  90 tablet    Refill:  0  . methylPREDNISolone (MEDROL DOSEPAK) 4 MG TBPK tablet    Sig: TAKE AS DIRECTED    Dispense:  21 tablet    Refill:  0  . Colchicine (MITIGARE) 0.6 MG CAPS    Sig: Take 1 tablet by mouth 2 (two) times daily.    Dispense:  60 capsule    Refill:  5     Follow-up: Return in about 3 months (around 10/23/2019).  Sanda Linger, MD

## 2019-07-26 ENCOUNTER — Other Ambulatory Visit: Payer: 59

## 2019-07-26 ENCOUNTER — Ambulatory Visit (INDEPENDENT_AMBULATORY_CARE_PROVIDER_SITE_OTHER): Payer: 59

## 2019-07-26 DIAGNOSIS — M25561 Pain in right knee: Secondary | ICD-10-CM

## 2019-07-26 LAB — LIPID PANEL
Cholesterol: 140 mg/dL (ref 0–200)
HDL: 54.7 mg/dL (ref >39.00)
LDL Cholesterol: 74 mg/dL (ref 0–99)
NonHDL: 84.99
Total CHOL/HDL Ratio: 3
Triglycerides: 54 mg/dL (ref 0.0–149.0)
VLDL: 10.8 mg/dL (ref 0.0–40.0)

## 2019-07-26 LAB — PSA: PSA: 0.51 ng/mL (ref 0.10–4.00)

## 2019-07-26 LAB — URIC ACID: Uric Acid, Serum: 5.7 mg/dL (ref 4.0–7.8)

## 2019-07-29 LAB — RPR: RPR Ser Ql: NONREACTIVE

## 2019-07-30 ENCOUNTER — Telehealth: Payer: Self-pay

## 2019-07-30 DIAGNOSIS — M1000 Idiopathic gout, unspecified site: Secondary | ICD-10-CM

## 2019-07-30 MED ORDER — MITIGARE 0.6 MG PO CAPS: 1.0000 | ORAL_CAPSULE | Freq: Two times a day (BID) | ORAL | 5 refills | Status: DC

## 2019-07-30 NOTE — Telephone Encounter (Addendum)
Nurtec KEY: [id number]  Colchicine - per insurance coverage, Mitigare is preferred. Per PCP, okay to change. New erx has been sent to reflect change.

## 2019-07-31 NOTE — Telephone Encounter (Signed)
Nurtec PA was not completed due to not covered under the plans benefits.

## 2019-08-14 ENCOUNTER — Ambulatory Visit (INDEPENDENT_AMBULATORY_CARE_PROVIDER_SITE_OTHER): Payer: 59 | Admitting: Orthopaedic Surgery

## 2019-08-14 ENCOUNTER — Encounter: Payer: Self-pay | Admitting: Orthopaedic Surgery

## 2019-08-14 ENCOUNTER — Other Ambulatory Visit: Payer: Self-pay

## 2019-08-14 DIAGNOSIS — M1711 Unilateral primary osteoarthritis, right knee: Secondary | ICD-10-CM | POA: Diagnosis not present

## 2019-08-14 DIAGNOSIS — M1712 Unilateral primary osteoarthritis, left knee: Secondary | ICD-10-CM

## 2019-08-14 MED ORDER — CELECOXIB 200 MG PO CAPS: 200.0000 mg | ORAL_CAPSULE | Freq: Two times a day (BID) | ORAL | 3 refills | Status: DC

## 2019-08-14 NOTE — Progress Notes (Signed)
Office Visit Note   Patient: Bradley Daniels           Date of Birth: 1970/10/13           MRN: [id number] Visit Date: 08/14/2019              Requested by: Janith Lima, MD [address] [place],  [place] [address] PCP: Janith Lima, MD   Assessment & Plan: Visit Diagnoses:  1. Primary osteoarthritis of right knee   2. Primary osteoarthritis of left knee     Plan: My impression is bilateral knee tricompartmental DJD.  I reviewed his recent x-rays which show bone-on-bone changes in the medial compartment of the right knee.  Based on our discussion patient would like to try Celebrex and Voltaren gel first.  He will return if he is interested in a cortisone injection.  Follow-Up Instructions: Return if symptoms worsen or fail to improve.   Orders:  No orders of the defined types were placed in this encounter.  Meds ordered this encounter  Medications  . celecoxib (CELEBREX) 200 MG capsule    Sig: Take 1 capsule (200 mg total) by mouth 2 (two) times daily.    Dispense:  30 capsule    Refill:  3      Procedures: No procedures performed   Clinical Data: No additional findings.   Subjective: Chief Complaint  Patient presents with  . Left Knee - Pain  . Right Knee - Pain    Patient is a very pleasant 49 year old gentleman who I saw back in 2016 for bilateral knee gout comes in for evaluation of chronic bilateral knee pain worse on the right.  He denies any recent history of gout.  He said the previous cortisone injection really helped.  He now complains of more of a chronic aching pain that is worse with activity and with flexion.  He feels like cracking and popping in his knees.  He takes ibuprofen.  Denies any numbness and tingling or injuries.   Review of Systems  Constitutional: Negative.   All other systems reviewed and are negative.    Objective: Vital Signs: There were no vitals taken for this visit.  Physical Exam Vitals and nursing note reviewed.   Constitutional:      Appearance: He is well-developed.  HENT:     Head: Normocephalic and atraumatic.  Eyes:     Pupils: Pupils are equal, round, and reactive to light.  Pulmonary:     Effort: Pulmonary effort is normal.  Abdominal:     Palpations: Abdomen is soft.  Musculoskeletal:        General: Normal range of motion.     Cervical back: Neck supple.  Skin:    General: Skin is warm.  Neurological:     Mental Status: He is alert and oriented to person, place, and time.  Psychiatric:        Behavior: Behavior normal.        Thought Content: Thought content normal.        Judgment: Judgment normal.     Ortho Exam Bilateral knee exam shows no joint effusion.  Positive patellofemoral crepitus.  Minimal restriction range of motion with mild pain.  Collaterals and cruciates are stable. Specialty Comments:  No specialty comments available.  Imaging: No results found.   PMFS History: Patient Active Problem List   Diagnosis Date Noted  . Colon cancer screening 07/25/2019  . Acute pain of right knee 07/25/2019  . Acute  idiopathic gout of right knee 07/25/2019  . Essential hypertension 03/26/2019  . Intractable chronic migraine without aura and with status migrainosus 03/26/2019  . Exposure to syphilis 05/09/2017  . Routine general medical examination at a health care facility 05/09/2017  . Idiopathic gout 05/09/2017  . Primary osteoarthritis of both knees 05/09/2017   Past Medical History:  Diagnosis Date  . Arthritis   . GERD (gastroesophageal reflux disease)     Family History  Problem Relation Age of Onset  . Cancer Neg Hx   . Drug abuse Neg Hx   . Early death Neg Hx   . Heart disease Neg Hx   . Hyperlipidemia Neg Hx   . Hypertension Neg Hx   . Kidney disease Neg Hx   . Stroke Neg Hx     History reviewed. No pertinent surgical history. Social History   Occupational History  . Not on file  Tobacco Use  . Smoking status: Never Smoker  . Smokeless  tobacco: Never Used  Substance and Sexual Activity  . Alcohol use: No  . Drug use: No  . Sexual activity: Not on file

## 2019-08-16 LAB — COLOGUARD: Cologuard: NEGATIVE

## 2019-08-21 ENCOUNTER — Encounter: Payer: Self-pay | Admitting: Internal Medicine

## 2019-08-22 ENCOUNTER — Telehealth: Payer: Self-pay

## 2019-08-22 NOTE — Telephone Encounter (Signed)
Pt advised of negative cologuard results, he verbalized understanding.

## 2019-09-04 ENCOUNTER — Encounter: Payer: Self-pay | Admitting: Orthopaedic Surgery

## 2019-09-04 ENCOUNTER — Ambulatory Visit (INDEPENDENT_AMBULATORY_CARE_PROVIDER_SITE_OTHER): Payer: 59 | Admitting: Orthopaedic Surgery

## 2019-09-04 ENCOUNTER — Other Ambulatory Visit: Payer: Self-pay

## 2019-09-04 DIAGNOSIS — M1712 Unilateral primary osteoarthritis, left knee: Secondary | ICD-10-CM | POA: Diagnosis not present

## 2019-09-04 DIAGNOSIS — M1711 Unilateral primary osteoarthritis, right knee: Secondary | ICD-10-CM | POA: Insufficient documentation

## 2019-09-04 MED ORDER — METHYLPREDNISOLONE ACETATE 40 MG/ML IJ SUSP
40.0000 mg | INTRAMUSCULAR | Status: AC | PRN
  Administered 2019-09-04: 40 mg via INTRA_ARTICULAR

## 2019-09-04 MED ORDER — BUPIVACAINE HCL 0.5 % IJ SOLN
2.0000 mL | INTRAMUSCULAR | Status: AC | PRN
  Administered 2019-09-04: 2 mL via INTRA_ARTICULAR

## 2019-09-04 MED ORDER — LIDOCAINE HCL 1 % IJ SOLN
2.0000 mL | INTRAMUSCULAR | Status: AC | PRN
  Administered 2019-09-04: 2 mL

## 2019-09-04 NOTE — Progress Notes (Signed)
Office Visit Note   Patient: Bradley Daniels           Date of Birth: Dec 10, 1970           MRN: [id number] Visit Date: 09/04/2019              Requested by: [name], MD [address] [place],  [place] [address] PCP: [name], MD   Assessment & Plan: Visit Diagnoses:  1. Primary osteoarthritis of right knee   2. Primary osteoarthritis of left knee     Plan: 20 cc of joint fluid aspirated from the right knee and cortisone was injected.  Left knee was just injected with cortisone today.  Patient tolerates well.  We will see him back as needed.  Follow-Up Instructions: Return if symptoms worsen or fail to improve.   Orders:  No orders of the defined types were placed in this encounter.  No orders of the defined types were placed in this encounter.     Procedures: Large Joint Inj: bilateral knee on 09/04/2019 4:01 PM Indications: pain Details: 22 G needle  Arthrogram: No  Medications (Right): 2 mL lidocaine 1 %; 2 mL bupivacaine 0.5 %; 40 mg methylPREDNISolone acetate 40 MG/ML Medications (Left): 2 mL lidocaine 1 %; 2 mL bupivacaine 0.5 %; 40 mg methylPREDNISolone acetate 40 MG/ML Outcome: tolerated well, no immediate complications Patient was prepped and draped in the usual sterile fashion.       Clinical Data: No additional findings.   Subjective: Chief Complaint  Patient presents with  . Right Knee - Pain, Follow-up    Patient is 49 year old gentleman who is coming in today for follow-up of bilateral knee pain.  We previously saw him and prescribe Celebrex and Voltaren gel which did not help.  He is interested in injections today.   Review of Systems   Objective: Vital Signs: There were no vitals taken for this visit.  Physical Exam  Ortho Exam Right knee exam shows a moderate joint effusion.  No joint effusion the left knee.  Otherwise exams are unchanged. Specialty Comments:  No specialty comments available.  Imaging: No results  found.   PMFS History: Patient Active Problem List   Diagnosis Date Noted  . Primary osteoarthritis of left knee 09/04/2019  . Primary osteoarthritis of right knee 09/04/2019  . Colon cancer screening 07/25/2019  . Acute pain of right knee 07/25/2019  . Acute idiopathic gout of right knee 07/25/2019  . Essential hypertension 03/26/2019  . Intractable chronic migraine without aura and with status migrainosus 03/26/2019  . Exposure to syphilis 05/09/2017  . Routine general medical examination at a health care facility 05/09/2017  . Idiopathic gout 05/09/2017  . Primary osteoarthritis of both knees 05/09/2017   Past Medical History:  Diagnosis Date  . Arthritis   . GERD (gastroesophageal reflux disease)     Family History  Problem Relation Age of Onset  . Cancer Neg Hx   . Drug abuse Neg Hx   . Early death Neg Hx   . Heart disease Neg Hx   . Hyperlipidemia Neg Hx   . Hypertension Neg Hx   . Kidney disease Neg Hx   . Stroke Neg Hx     History reviewed. No pertinent surgical history. Social History   Occupational History  . Not on file  Tobacco Use  . Smoking status: Never Smoker  . Smokeless tobacco: Never Used  Substance and Sexual Activity  . Alcohol use: No  . Drug  use: No  . Sexual activity: Not on file

## 2019-09-12 ENCOUNTER — Other Ambulatory Visit: Payer: Self-pay | Admitting: Internal Medicine

## 2019-09-12 DIAGNOSIS — M170 Bilateral primary osteoarthritis of knee: Secondary | ICD-10-CM

## 2019-10-04 ENCOUNTER — Other Ambulatory Visit: Payer: Self-pay | Admitting: Internal Medicine

## 2019-10-04 DIAGNOSIS — M170 Bilateral primary osteoarthritis of knee: Secondary | ICD-10-CM

## 2019-10-11 ENCOUNTER — Ambulatory Visit (INDEPENDENT_AMBULATORY_CARE_PROVIDER_SITE_OTHER): Payer: 59 | Admitting: Orthopaedic Surgery

## 2019-10-11 ENCOUNTER — Encounter: Payer: Self-pay | Admitting: Orthopaedic Surgery

## 2019-10-11 ENCOUNTER — Other Ambulatory Visit: Payer: Self-pay

## 2019-10-11 VITALS — Ht 74.0 in | Wt 229.0 lb

## 2019-10-11 DIAGNOSIS — M1711 Unilateral primary osteoarthritis, right knee: Secondary | ICD-10-CM

## 2019-10-11 MED ORDER — MELOXICAM 7.5 MG PO TABS: 7.5000 mg | ORAL_TABLET | Freq: Two times a day (BID) | ORAL | 2 refills | Status: DC | PRN

## 2019-10-11 NOTE — Progress Notes (Signed)
Office Visit Note   Patient: Bradley Daniels           Date of Birth: 1970/08/15           MRN: [id number] Visit Date: 10/11/2019              Requested by: [name], MD [address] [place],  [place] [address] PCP: [name], MD   Assessment & Plan: Visit Diagnoses:  1. Primary osteoarthritis of right knee     Plan: Impression is chronic osteoarthritis right knee.  He has not gotten much relief from his previous injections.  We discussed the possibility of repeating it but this may not give him much relief.  We also mentioned weight loss, oral NSAIDs, knee replacement surgery.  Based on discussion he would like to try some meloxicam for now and see if he can live with the pain for little bit longer.  He understands that he is likely going to need a knee replacement in the near future.  Follow-Up Instructions: Return if symptoms worsen or fail to improve.   Orders:  No orders of the defined types were placed in this encounter.  Meds ordered this encounter  Medications  . meloxicam (MOBIC) 7.5 MG tablet    Sig: Take 1 tablet (7.5 mg total) by mouth 2 (two) times daily as needed for pain.    Dispense:  30 tablet    Refill:  2      Procedures: No procedures performed   Clinical Data: No additional findings.   Subjective: Chief Complaint  Patient presents with  . Right Knee - Follow-up, Pain    Patient returns today for follow-up of right knee osteoarthritis.  Cortisone injection and aspiration on 09/04/2019 did not help significantly.  He continues to have pain that radiates up his thigh and he has discomfort in the posterior aspect of the knee.  Denies any injuries.   Review of Systems  Constitutional: Negative.   All other systems reviewed and are negative.    Objective: Vital Signs: Ht 6\' 2"  (1.88 m)   Wt 229 lb (103.9 kg)   BMI 29.40 kg/m   Physical Exam Vitals and nursing note reviewed.  Constitutional:      Appearance: He is  well-developed.  Pulmonary:     Effort: Pulmonary effort is normal.  Abdominal:     Palpations: Abdomen is soft.  Skin:    General: Skin is warm.  Neurological:     Mental Status: He is alert and oriented to person, place, and time.  Psychiatric:        Behavior: Behavior normal.        Thought Content: Thought content normal.        Judgment: Judgment normal.     Ortho Exam Right knee shows no joint effusion.  Small Baker's cyst. Specialty Comments:  No specialty comments available.  Imaging: No results found.   PMFS History: Patient Active Problem List   Diagnosis Date Noted  . Primary osteoarthritis of left knee 09/04/2019  . Primary osteoarthritis of right knee 09/04/2019  . Colon cancer screening 07/25/2019  . Acute pain of right knee 07/25/2019  . Acute idiopathic gout of right knee 07/25/2019  . Essential hypertension 03/26/2019  . Intractable chronic migraine without aura and with status migrainosus 03/26/2019  . Exposure to syphilis 05/09/2017  . Routine general medical examination at a health care facility 05/09/2017  . Idiopathic gout 05/09/2017  . Primary osteoarthritis of both knees  05/09/2017   Past Medical History:  Diagnosis Date  . Arthritis   . GERD (gastroesophageal reflux disease)     Family History  Problem Relation Age of Onset  . Cancer Neg Hx   . Drug abuse Neg Hx   . Early death Neg Hx   . Heart disease Neg Hx   . Hyperlipidemia Neg Hx   . Hypertension Neg Hx   . Kidney disease Neg Hx   . Stroke Neg Hx     History reviewed. No pertinent surgical history. Social History   Occupational History  . Not on file  Tobacco Use  . Smoking status: Never Smoker  . Smokeless tobacco: Never Used  Substance and Sexual Activity  . Alcohol use: No  . Drug use: No  . Sexual activity: Not on file

## 2020-01-07 ENCOUNTER — Encounter: Payer: Self-pay | Admitting: Orthopaedic Surgery

## 2020-01-07 ENCOUNTER — Ambulatory Visit (INDEPENDENT_AMBULATORY_CARE_PROVIDER_SITE_OTHER): Payer: 59 | Admitting: Orthopaedic Surgery

## 2020-01-07 VITALS — Ht 74.0 in | Wt 229.0 lb

## 2020-01-07 DIAGNOSIS — M1711 Unilateral primary osteoarthritis, right knee: Secondary | ICD-10-CM

## 2020-01-07 MED ORDER — METHYLPREDNISOLONE ACETATE 40 MG/ML IJ SUSP
40.0000 mg | INTRAMUSCULAR | Status: AC | PRN
  Administered 2020-01-07: 40 mg via INTRA_ARTICULAR

## 2020-01-07 MED ORDER — LIDOCAINE HCL 1 % IJ SOLN
2.0000 mL | INTRAMUSCULAR | Status: AC | PRN
  Administered 2020-01-07: 2 mL

## 2020-01-07 MED ORDER — BUPIVACAINE HCL 0.5 % IJ SOLN
2.0000 mL | INTRAMUSCULAR | Status: AC | PRN
  Administered 2020-01-07: 2 mL via INTRA_ARTICULAR

## 2020-01-07 NOTE — Progress Notes (Signed)
° °  Office Visit Note   Patient: Bradley Daniels           Date of Birth: [birth year]           MRN: [id number] Visit Date: 01/07/2020              Requested by: [name], MD [address] [place],  [place] [address] PCP: [name], MD   Assessment & Plan: Visit Diagnoses:  1. Primary osteoarthritis of right knee     Plan: Impression is right knee DJD with effusion.  Aspiration injection performed today with 35 cc of joint fluid.  Compression wrap applied.  Follow-up as needed.  Follow-Up Instructions: Return if symptoms worsen or fail to improve.   Orders:  No orders of the defined types were placed in this encounter.  No orders of the defined types were placed in this encounter.     Procedures: Large Joint Inj: R knee on 01/07/2020 7:07 PM Indications: pain Details: 22 G needle  Arthrogram: No  Medications: 40 mg methylPREDNISolone acetate 40 MG/ML; 2 mL lidocaine 1 %; 2 mL bupivacaine 0.5 % Consent was given by the patient. Patient was prepped and draped in the usual sterile fashion.       Clinical Data: No additional findings.   Subjective: Chief Complaint  Patient presents with   Right Knee - Pain    Patient returns today for follow-up of right knee DJD.  He would like to try another injection.  Currently not ready to undergo a knee replacement.  He feels that there is a lot of swelling in his knee.   Review of Systems   Objective: Vital Signs: Ht 6\' 2"  (1.88 m)    Wt 229 lb (103.9 kg)    BMI 29.40 kg/m   Physical Exam  Ortho Exam Right knee shows a moderate joint effusion.  Otherwise unremarkable. Specialty Comments:  No specialty comments available.  Imaging: No results found.   PMFS History: Patient Active Problem List   Diagnosis Date Noted   Primary osteoarthritis of left knee 09/04/2019   Primary osteoarthritis of right knee 09/04/2019   Colon cancer screening 07/25/2019   Acute pain of right knee 07/25/2019    Acute idiopathic gout of right knee 07/25/2019   Essential hypertension 03/26/2019   Intractable chronic migraine without aura and with status migrainosus 03/26/2019   Exposure to syphilis 05/09/2017   Routine general medical examination at a health care facility 05/09/2017   Idiopathic gout 05/09/2017   Primary osteoarthritis of both knees 05/09/2017   Past Medical History:  Diagnosis Date   Arthritis    GERD (gastroesophageal reflux disease)     Family History  Problem Relation Age of Onset   Cancer Neg Hx    Drug abuse Neg Hx    Early death Neg Hx    Heart disease Neg Hx    Hyperlipidemia Neg Hx    Hypertension Neg Hx    Kidney disease Neg Hx    Stroke Neg Hx     History reviewed. No pertinent surgical history. Social History   Occupational History   Not on file  Tobacco Use   Smoking status: Never Smoker   Smokeless tobacco: Never Used  Substance and Sexual Activity   Alcohol use: No   Drug use: No   Sexual activity: Not on file

## 2020-01-08 ENCOUNTER — Telehealth: Payer: Self-pay | Admitting: Orthopaedic Surgery

## 2020-01-08 MED ORDER — DICLOFENAC SODIUM 75 MG PO TBEC
75.0000 mg | DELAYED_RELEASE_TABLET | Freq: Two times a day (BID) | ORAL | 2 refills | Status: DC
Start: 2020-01-08 — End: 2020-04-15

## 2020-01-08 NOTE — Telephone Encounter (Signed)
Patient called.   He said pain medicine was supposed to be called in for him at the time of his last appointment. He is still in need.   Call back: [phone]

## 2020-01-08 NOTE — Addendum Note (Signed)
Addended by: Mayra Reel on: 01/08/2020 04:49 PM   Modules accepted: Orders

## 2020-04-14 ENCOUNTER — Other Ambulatory Visit: Payer: Self-pay | Admitting: Orthopaedic Surgery

## 2020-06-10 ENCOUNTER — Other Ambulatory Visit: Payer: Self-pay

## 2020-06-10 ENCOUNTER — Ambulatory Visit: Payer: 59 | Admitting: Internal Medicine

## 2020-06-10 ENCOUNTER — Encounter: Payer: Self-pay | Admitting: Internal Medicine

## 2020-06-10 VITALS — BP 128/86 | HR 72 | Temp 98.1°F | Ht 74.0 in | Wt 233.0 lb

## 2020-06-10 DIAGNOSIS — G43711 Chronic migraine without aura, intractable, with status migrainosus: Secondary | ICD-10-CM

## 2020-06-10 DIAGNOSIS — Z23 Encounter for immunization: Secondary | ICD-10-CM | POA: Diagnosis not present

## 2020-06-10 MED ORDER — QULIPTA 60 MG PO TABS: 1.0000 | ORAL_TABLET | Freq: Every day | ORAL | 1 refills | Status: DC

## 2020-06-10 NOTE — Patient Instructions (Signed)

## 2020-06-10 NOTE — Progress Notes (Signed)
Subjective:  Patient ID: Bradley Daniels, male    DOB: 14-Aug-1970  Age: 49 y.o. MRN: [id number]  CC: Headache  This visit occurred during the SARS-CoV-2 public health emergency.  Safety protocols were in place, including screening questions prior to the visit, additional usage of staff PPE, and extensive cleaning of exam room while observing appropriate contact time as indicated for disinfecting solutions.    HPI Bradley Daniels presents for f/up - He continues to complain of headaches.  Earlier this year he was prescribed a CGRP antagonist which it sounds like helped.  He has run out of the CGRP antagonist and continues to complain of frontal headaches.  He has not gotten much symptom relief with diclofenac.  He denies changes in his vision or hearing.  He denies nausea, vomiting, or paresthesias.  He had an MRI done about a year ago that was concerning for chronic migraine.  There was no mass or bleed.  Outpatient Medications Prior to Visit  Medication Sig Dispense Refill  . diclofenac (VOLTAREN) 75 MG EC tablet TAKE 1 TABLET(75 MG) BY MOUTH TWICE DAILY 30 tablet 2  . MITIGARE 0.6 MG CAPS Take 1 capsule by mouth 2 (two) times daily. 60 capsule 5  . celecoxib (CELEBREX) 200 MG capsule Take 1 capsule (200 mg total) by mouth 2 (two) times daily. 30 capsule 3  . meloxicam (MOBIC) 15 MG tablet TAKE 1 TABLET BY MOUTH DAILY FOR KNEE PAIN 90 tablet 1  . meloxicam (MOBIC) 7.5 MG tablet Take 1 tablet (7.5 mg total) by mouth 2 (two) times daily as needed for pain. 30 tablet 2  . Rimegepant Sulfate (NURTEC) 75 MG TBDP Take 1 tablet by mouth daily as needed. 8 tablet 5   No facility-administered medications prior to visit.    ROS Review of Systems  Constitutional: Negative for diaphoresis and fatigue.  HENT: Negative.   Eyes: Negative for pain and visual disturbance.  Respiratory: Negative for cough, chest tightness, shortness of breath and wheezing.   Cardiovascular: Negative for chest pain,  palpitations and leg swelling.  Gastrointestinal: Negative for abdominal pain, constipation, diarrhea, nausea and vomiting.  Genitourinary: Negative.  Negative for difficulty urinating.  Musculoskeletal: Negative for arthralgias and myalgias.  Skin: Negative.   Neurological: Positive for headaches. Negative for dizziness, seizures, facial asymmetry, weakness, light-headedness and numbness.  Hematological: Negative for adenopathy. Does not bruise/bleed easily.  Psychiatric/Behavioral: Negative.     Objective:  BP 128/86   Pulse 72   Temp 98.1 F (36.7 C) (Oral)   Ht 6\' 2"  (1.88 m)   Wt 233 lb (105.7 kg)   SpO2 98%   BMI 29.92 kg/m   BP Readings from Last 3 Encounters:  06/10/20 128/86  07/25/19 130/80  03/26/19 138/88    Wt Readings from Last 3 Encounters:  06/10/20 233 lb (105.7 kg)  01/07/20 229 lb (103.9 kg)  10/11/19 229 lb (103.9 kg)    Physical Exam Vitals reviewed.  Constitutional:      Appearance: He is well-developed.  Eyes:     General: No scleral icterus.    Extraocular Movements: Extraocular movements intact.     Right eye: Normal extraocular motion and no nystagmus.     Left eye: Normal extraocular motion and no nystagmus.     Pupils: Pupils are equal, round, and reactive to light. Pupils are equal.     Right eye: Pupil is round and reactive.     Left eye: Pupil is round and reactive.  Cardiovascular:  Rate and Rhythm: Normal rate and regular rhythm.     Heart sounds: No murmur heard.   Pulmonary:     Effort: Pulmonary effort is normal.     Breath sounds: No stridor. No wheezing, rhonchi or rales.  Abdominal:     General: Abdomen is flat. Bowel sounds are normal.     Palpations: There is no hepatomegaly, splenomegaly or mass.  Musculoskeletal:        General: Normal range of motion.     Cervical back: Normal range of motion and neck supple.     Right lower leg: No edema.     Left lower leg: No edema.  Skin:    General: Skin is warm and dry.      Coloration: Skin is not pale.  Neurological:     General: No focal deficit present.     Mental Status: He is alert and oriented to person, place, and time. Mental status is at baseline.     Cranial Nerves: No cranial nerve deficit.     Sensory: No sensory deficit.     Motor: No weakness.     Coordination: Coordination normal.     Gait: Gait normal.     Deep Tendon Reflexes: Reflexes normal.  Psychiatric:        Mood and Affect: Mood normal.        Behavior: Behavior normal.     Lab Results  Component Value Date   WBC 6.1 03/26/2019   HGB 14.5 03/26/2019   HCT 43.9 03/26/2019   PLT 240.0 03/26/2019   GLUCOSE 101 (H) 03/26/2019   CHOL 140 07/26/2019   TRIG 54.0 07/26/2019   HDL 54.70 07/26/2019   LDLCALC 74 07/26/2019   ALT 32 03/26/2019   AST 35 03/26/2019   NA 138 03/26/2019   K 3.8 03/26/2019   CL 105 03/26/2019   CREATININE 1.05 03/26/2019   BUN 12 03/26/2019   CO2 26 03/26/2019   TSH 0.88 03/26/2019   PSA 0.51 07/26/2019    MR Brain Wo Contrast  Result Date: 05/12/2019 CLINICAL DATA:  Initial evaluation for chronic headaches, dizziness. EXAM: MRI HEAD WITHOUT CONTRAST TECHNIQUE: Multiplanar, multiecho pulse sequences of the brain and surrounding structures were obtained without intravenous contrast. COMPARISON:  None. FINDINGS: Brain: Cerebral volume within normal limits for patient age. Few scattered subcentimeter foci of T2/FLAIR hyperintensity noted involving the periventricular and subcortical white matter both cerebral hemispheres, nonspecific. No abnormal foci of restricted diffusion to suggest acute or subacute ischemia. Gray-white matter differentiation well maintained. No encephalomalacia to suggest chronic infarction. No foci of susceptibility artifact to suggest acute or chronic intracranial hemorrhage. No mass lesion, midline shift or mass effect. No hydrocephalus. No extra-axial fluid collection. Major dural sinuses are grossly patent. Incidental note  made of a partially empty sella. Suprasellar region normal. Midline structures intact and normal. Vascular: Major intracranial vascular flow voids well maintained and normal in appearance. Skull and upper cervical spine: Craniocervical junction normal. Visualized upper cervical spine within normal limits. Bone marrow signal intensity normal. No scalp soft tissue abnormality. Sinuses/Orbits: Globes and orbital soft tissues within normal limits. Paranasal sinuses are clear. No mastoid effusion. Inner ear structures normal. Other: None. IMPRESSION: 1. Few scattered foci of subcentimeter T2/FLAIR hyperintensities involving the supratentorial cerebral white matter. Findings are nonspecific, with primary differential considerations consisting of changes related to mild chronic small vessel ischemic disease, migrainous disorder, or other prior infectious or inflammatory disorder. 2. Otherwise unremarkable and normal brain MRI for age. Electronically  Signed   By: Rise Mu M.D.   On: 05/12/2019 21:19    Assessment & Plan:   Tiant was seen today for headache.  Diagnoses and all orders for this visit:  Intractable chronic migraine without aura and with status migrainosus- He has frequent migraines.  I recommended that he take a daily CGRP antagonist. -     Atogepant (QULIPTA) 60 MG TABS; Take 1 tablet by mouth daily.  Other orders -     Flu Vaccine QUAD 6+ mos PF IM (Fluarix Quad PF)   I have discontinued Hunt Maynor's Nurtec, celecoxib, meloxicam, and meloxicam. I am also having him start on Qulipta. Additionally, I am having him maintain his Mitigare and diclofenac.  Meds ordered this encounter  Medications  . Atogepant (QULIPTA) 60 MG TABS    Sig: Take 1 tablet by mouth daily.    Dispense:  90 tablet    Refill:  1     Follow-up: Return in about 3 months (around 09/08/2020).  Sanda Linger, MD

## 2020-10-12 ENCOUNTER — Other Ambulatory Visit: Payer: Self-pay | Admitting: Internal Medicine

## 2020-10-12 DIAGNOSIS — G43711 Chronic migraine without aura, intractable, with status migrainosus: Secondary | ICD-10-CM

## 2020-10-12 MED ORDER — QULIPTA 60 MG PO TABS: 1.0000 | ORAL_TABLET | Freq: Every day | ORAL | 1 refills | Status: DC

## 2021-01-25 IMAGING — MR MR HEAD W/O CM
10 series · 48 of 48 positions shown · non-contrast
Comparison: None.

CLINICAL DATA: Initial evaluation for chronic headaches, dizziness.

EXAM:
MRI HEAD WITHOUT CONTRAST
TECHNIQUE: Multiplanar, multiecho pulse sequences of the brain and surrounding
structures were obtained without intravenous contrast.

[Series 5: T1 · sagittal · 4.0mm · 0.75mm/px · 2 of 29 slices shown (1 of 2)]
[im 1/29]
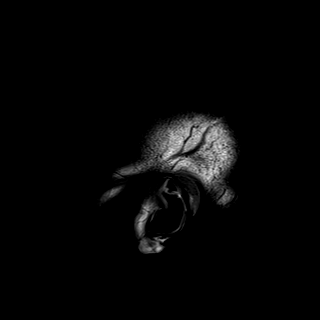
[im 29/29]
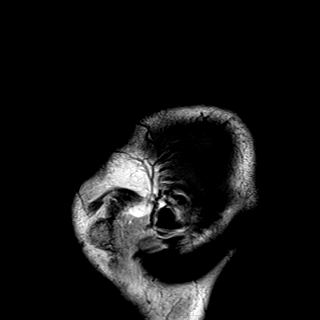

[Series 6: T2 · axial · 4.0mm · 0.36mm/px · z∈[-68,+72]mm · 2 of 28 slices shown (1 of 2)]
[im 1/28]
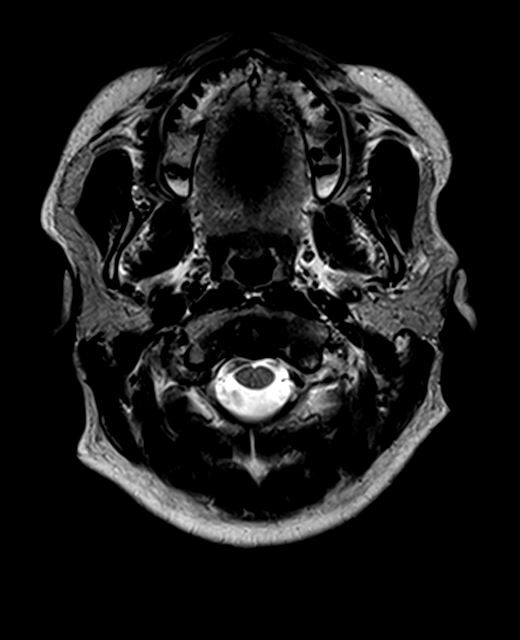
[im 28/28]
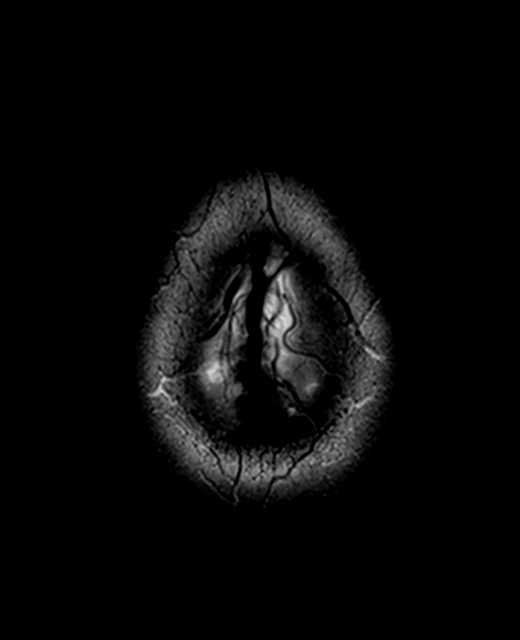

[Series 7: DWI · axial · 3.0mm · 1.44mm/px · z∈[-66,+69]mm · 8 of 84 slices shown (1 of 4)]
[im 1/84]
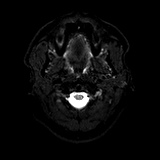
[im 12/84]
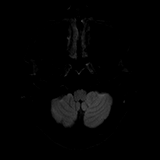
[im 24/84]
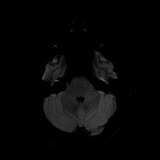
[im 36/84]
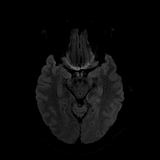
[im 48/84]
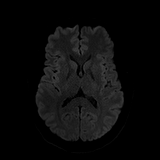
[im 60/84]
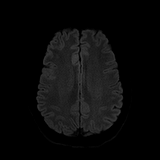
[im 72/84]
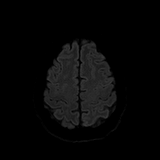
[im 84/84]
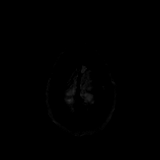

[Series 8: DWI · axial · 3.0mm · 1.44mm/px · z∈[-66,+69]mm · 4 of 41 slices shown (2 of 4)]
[im 1/41]
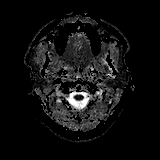
[im 14/41]
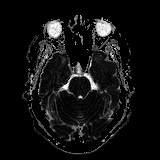
[im 27/41]
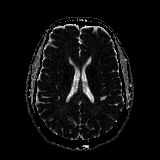
[im 41/41]
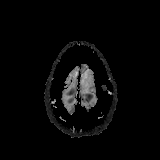

[Series 9: DWI · coronal · 5.0mm · 1.44mm/px · 5 of 60 slices shown (3 of 4)]
[im 1/60]
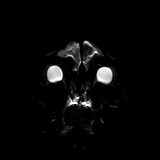
[im 15/60]
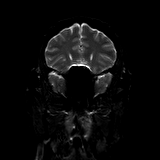
[im 30/60]
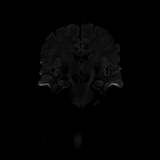
[im 45/60]
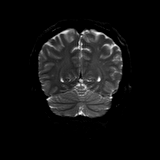
[im 60/60]
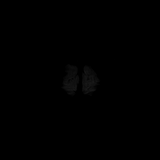

[Series 10: DWI · coronal · 5.0mm · 1.44mm/px · 3 of 30 slices shown (4 of 4)]
[im 1/30]
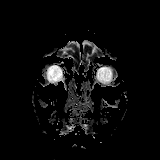
[im 15/30]
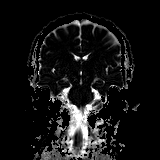
[im 30/30]
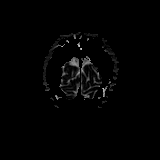

[Series 11: FLAIR · axial · 3.0mm · 0.72mm/px · z∈[-71,+76]mm · 3 of 30 slices shown]
[im 1/30]
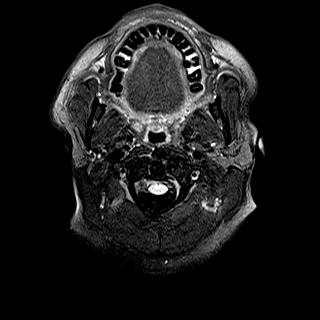
[im 15/30]
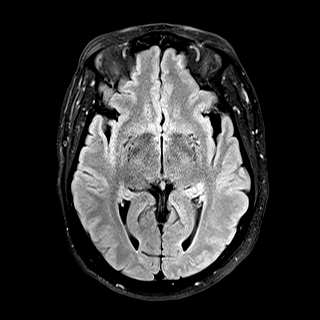
[im 30/30]
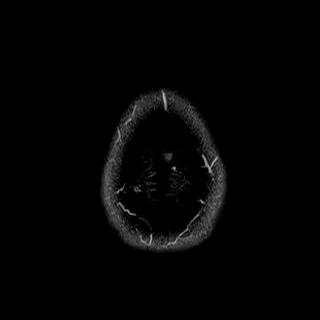

[Series 13: swi_images · axial · 4.0mm · 0.90mm/px · z∈[-68,+72]mm · 3 of 36 slices shown]
[im 1/36]
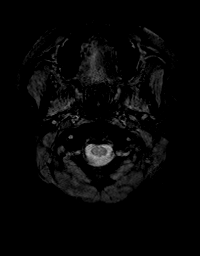
[im 18/36]
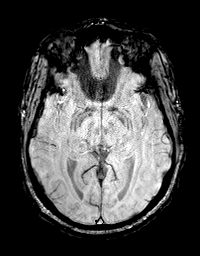
[im 36/36]
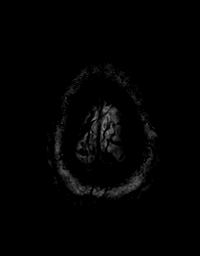

[Series 14: T1 · axial · 1.0mm · 0.94mm/px · z∈[-86,+72]mm · 15 of 160 slices shown (2 of 2)]
[im 1/160]
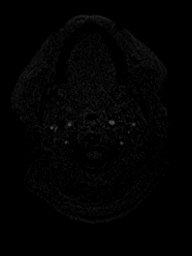
[im 12/160]
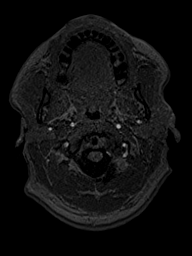
[im 23/160]
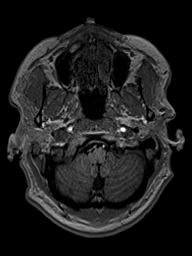
[im 35/160]
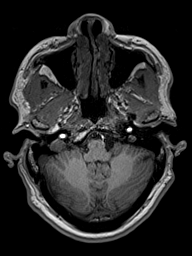
[im 46/160]
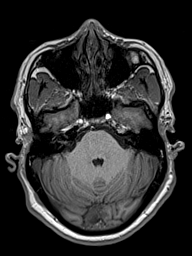
[im 57/160]
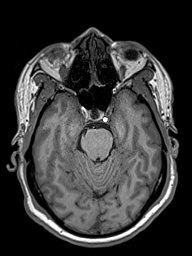
[im 69/160]
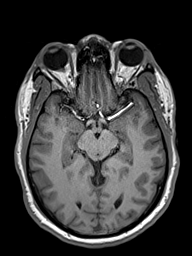
[im 80/160]
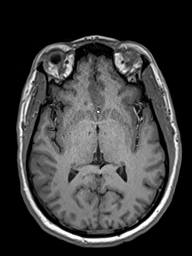
[im 91/160]
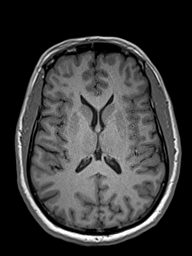
[im 103/160]
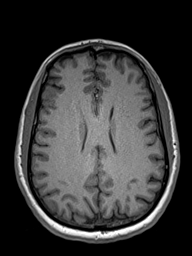
[im 114/160]
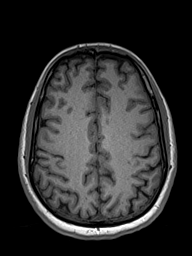
[im 125/160]
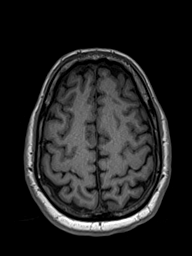
[im 137/160]
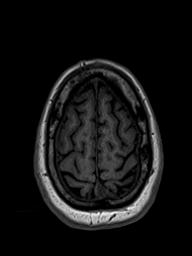
[im 148/160]
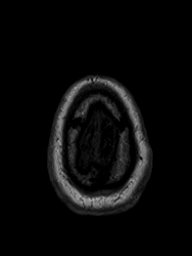
[im 160/160]
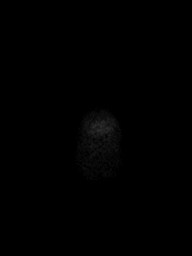

[Series 15: T2 · coronal · 4.5mm · 0.36mm/px · 3 of 30 slices shown (2 of 2)]
[im 1/30]
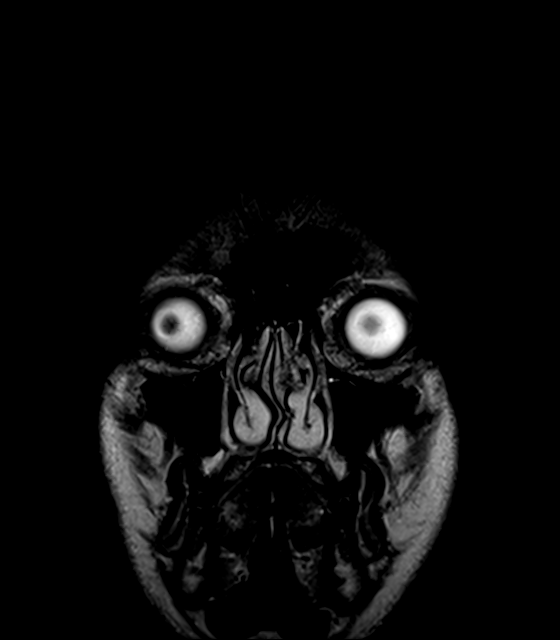
[im 15/30]
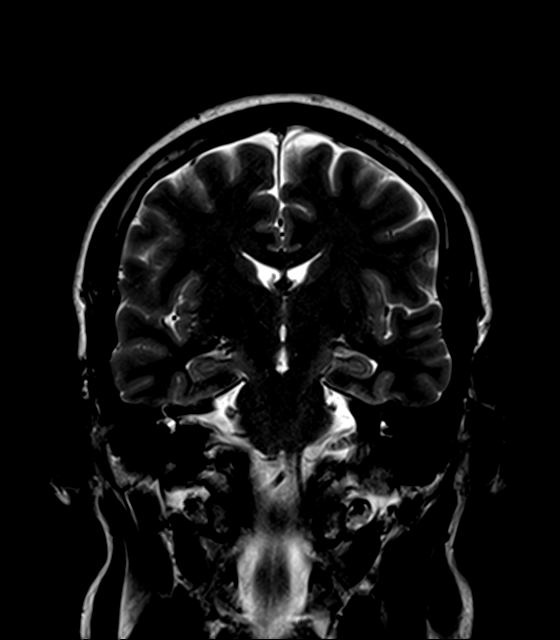
[im 30/30]
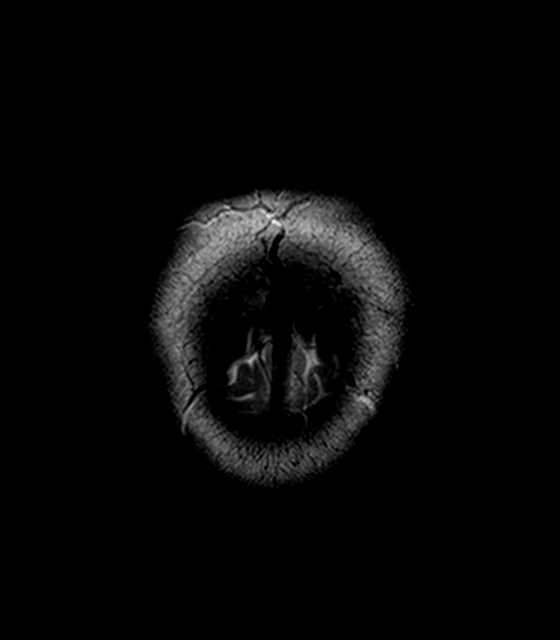

[48 of 48 positions shown; findings below may reference images not displayed]

FINDINGS: Brain: Cerebral volume within normal limits for patient age. Few
scattered subcentimeter foci of T2/FLAIR hyperintensity noted
involving the periventricular and subcortical white matter both
cerebral hemispheres, nonspecific.

No abnormal foci of restricted diffusion to suggest acute or
subacute ischemia. Gray-white matter differentiation well
maintained. No encephalomalacia to suggest chronic infarction. No
foci of susceptibility artifact to suggest acute or chronic
intracranial hemorrhage.

No mass lesion, midline shift or mass effect. No hydrocephalus. No
extra-axial fluid collection. Major dural sinuses are grossly
patent.

Incidental note made of a partially empty sella. Suprasellar region
normal. Midline structures intact and normal.

Vascular: Major intracranial vascular flow voids well maintained and
normal in appearance.

Skull and upper cervical spine: Craniocervical junction normal.
Visualized upper cervical spine within normal limits. Bone marrow
signal intensity normal. No scalp soft tissue abnormality.

Sinuses/Orbits: Globes and orbital soft tissues within normal
limits.

Paranasal sinuses are clear. No mastoid effusion. Inner ear
structures normal.

Other: None.
IMPRESSION: 1. Few scattered foci of subcentimeter T2/FLAIR hyperintensities
involving the supratentorial cerebral white matter. Findings are
nonspecific, with primary differential considerations consisting of
changes related to mild chronic small vessel ischemic disease,
migrainous disorder, or other prior infectious or inflammatory
disorder.
2. Otherwise unremarkable and normal brain MRI for age.

## 2021-11-02 ENCOUNTER — Ambulatory Visit (INDEPENDENT_AMBULATORY_CARE_PROVIDER_SITE_OTHER): Payer: Self-pay | Admitting: Orthopaedic Surgery

## 2021-11-02 ENCOUNTER — Encounter: Payer: Self-pay | Admitting: Orthopaedic Surgery

## 2021-11-02 ENCOUNTER — Ambulatory Visit (INDEPENDENT_AMBULATORY_CARE_PROVIDER_SITE_OTHER): Payer: Self-pay

## 2021-11-02 DIAGNOSIS — M79671 Pain in right foot: Secondary | ICD-10-CM

## 2021-11-02 DIAGNOSIS — M1711 Unilateral primary osteoarthritis, right knee: Secondary | ICD-10-CM

## 2021-11-02 DIAGNOSIS — G8929 Other chronic pain: Secondary | ICD-10-CM

## 2021-11-02 DIAGNOSIS — M25571 Pain in right ankle and joints of right foot: Secondary | ICD-10-CM

## 2021-11-02 MED ORDER — TRAMADOL HCL 50 MG PO TABS: 50.0000 mg | ORAL_TABLET | Freq: Two times a day (BID) | ORAL | 2 refills | Status: DC | PRN

## 2021-11-02 NOTE — Progress Notes (Signed)
? ?Office Visit Note ?  ?Patient: Bradley Daniels           ?Date of Birth: [birth year]           ?MRN: [id number] ?Visit Date: 11/02/2021 ?             ?Requested by: [name], MD ?[address] ?[place],  [place] [address] ?PCP: [name], MD ? ? ?Assessment & Plan: ?Visit Diagnoses:  ?1. Primary osteoarthritis of right knee   ?2. Chronic heel pain, right   ?3. Pain in right ankle and joints of right foot   ? ? ?Plan: Impression is right knee arthritis flareup and right foot plantar fasciitis.  Regards to the right knee, we have discussed repeat cortisone injection today.  Have also discussed viscosupplementation injection should cortisone fail to relieve his symptoms.  In regards to the plantar fasciitis, have discussed appropriate shoewear and orthotics.  I will also provide him with a Achilles stretching program.  He may consider cortisone injection if his symptoms do not improve.  Call with concerns or questions in the meantime. ? ?Follow-Up Instructions: Return if symptoms worsen or fail to improve.  ? ?Orders:  ?Orders Placed This Encounter  ?Procedures  ? Large Joint Inj: R knee  ? XR KNEE 3 VIEW RIGHT  ? DG Os Calcis Right  ? XR Ankle Complete Right  ? ?Meds ordered this encounter  ?Medications  ? traMADol (ULTRAM) 50 MG tablet  ?  Sig: Take 1 tablet (50 mg total) by mouth 2 (two) times daily as needed.  ?  Dispense:  20 tablet  ?  Refill:  2  ? ? ? ? Procedures: ?Large Joint Inj: R knee on 11/02/2021 4:09 PM ?Indications: pain ?Details: 22 G needle, anterolateral approach ?Medications: 2 mL lidocaine 1 %; 2 mL bupivacaine 0.25 %; 40 mg methylPREDNISolone acetate 40 MG/ML ? ? ? ? ?Clinical Data: ?No additional findings. ? ? ?Subjective: ?Chief Complaint  ?Patient presents with  ? Right Knee - Pain  ? Right Foot - Pain  ?  +RIGHT HEEL PAIN ?  ? ? ?HPI patient is a pleasant 51 year old gentleman who comes in today with right knee and right heel pain.  Regards to his right knee, he has underlying  osteoarthritis.  He was seen in our office about 2 years ago over the right knee was aspirated and injected with cortisone.  He had pretty good relief following the injection.  His symptoms have returned.  The pain is worse with standing as well as with any activity.  He has associated cracking sensations.  Takes an occasional ibuprofen which minimally helps his pain.  In regards to the right foot, the heel has been bothering him for the past few months.  He denies any injury but does note he recently changed his shoes.  Pain seems to be worse first thing in the morning.  No history of plantar fasciitis. ? ?Review of Systems as detailed in HPI.  All others reviewed and are negative. ? ? ?Objective: ?Vital Signs: There were no vitals taken for this visit. ? ?Physical Exam well-developed well-nourished gentleman in no acute distress.  Alert and oriented x3. ? ?Ortho Exam right knee exam shows no effusion.  Range of motion 0 to 120 degrees.  No joint line tenderness.  Moderate patellofemoral crepitus.  Ligaments are stable.  He is neurovascular tact distally.  Right foot exam shows moderate tenderness to the plantar fascia insertion at the heel.  He  can dorsiflex to 15 degrees.  He is neurovascular intact distally. ? ?Specialty Comments:  ?No specialty comments available. ? ?Imaging: ?XR Ankle Complete Right ? ?Result Date: 11/02/2021 ?No acute or structural abnormalities ? ?XR KNEE 3 VIEW RIGHT ? ?Result Date: 11/02/2021 ?X-rays demonstrate moderate tricompartmental degenerative changes  ? ? ?PMFS History: ?Patient Active Problem List  ? Diagnosis Date Noted  ? Primary osteoarthritis of left knee 09/04/2019  ? Primary osteoarthritis of right knee 09/04/2019  ? Colon cancer screening 07/25/2019  ? Acute pain of right knee 07/25/2019  ? Acute idiopathic gout of right knee 07/25/2019  ? Essential hypertension 03/26/2019  ? Intractable chronic migraine without aura and with status migrainosus 03/26/2019  ? Exposure to  syphilis 05/09/2017  ? Routine general medical examination at a health care facility 05/09/2017  ? Idiopathic gout 05/09/2017  ? Primary osteoarthritis of both knees 05/09/2017  ? ?Past Medical History:  ?Diagnosis Date  ? Arthritis   ? GERD (gastroesophageal reflux disease)   ?  ?Family History  ?Problem Relation Age of Onset  ? Cancer Neg Hx   ? Drug abuse Neg Hx   ? Early death Neg Hx   ? Heart disease Neg Hx   ? Hyperlipidemia Neg Hx   ? Hypertension Neg Hx   ? Kidney disease Neg Hx   ? Stroke Neg Hx   ?  ?History reviewed. No pertinent surgical history. ?Social History  ? ?Occupational History  ? Not on file  ?Tobacco Use  ? Smoking status: Never  ? Smokeless tobacco: Never  ?Substance and Sexual Activity  ? Alcohol use: No  ? Drug use: No  ? Sexual activity: Not on file  ? ? ? ? ? ? ?

## 2021-11-03 MED ORDER — BUPIVACAINE HCL 0.25 % IJ SOLN
2.0000 mL | INTRAMUSCULAR | Status: AC | PRN
  Administered 2021-11-02: 2 mL via INTRA_ARTICULAR

## 2021-11-03 MED ORDER — LIDOCAINE HCL 1 % IJ SOLN
2.0000 mL | INTRAMUSCULAR | Status: AC | PRN
  Administered 2021-11-02: 2 mL

## 2021-11-03 MED ORDER — METHYLPREDNISOLONE ACETATE 40 MG/ML IJ SUSP
40.0000 mg | INTRAMUSCULAR | Status: AC | PRN
  Administered 2021-11-02: 40 mg via INTRA_ARTICULAR

## 2022-02-15 ENCOUNTER — Ambulatory Visit: Payer: Self-pay | Admitting: Orthopaedic Surgery

## 2022-03-22 ENCOUNTER — Ambulatory Visit: Payer: Self-pay | Admitting: Orthopaedic Surgery

## 2022-10-04 ENCOUNTER — Other Ambulatory Visit (INDEPENDENT_AMBULATORY_CARE_PROVIDER_SITE_OTHER): Payer: BC Managed Care – PPO

## 2022-10-04 ENCOUNTER — Ambulatory Visit: Payer: BC Managed Care – PPO | Admitting: Orthopaedic Surgery

## 2022-10-04 ENCOUNTER — Encounter: Payer: Self-pay | Admitting: Orthopaedic Surgery

## 2022-10-04 DIAGNOSIS — M1711 Unilateral primary osteoarthritis, right knee: Secondary | ICD-10-CM

## 2022-10-04 DIAGNOSIS — M25571 Pain in right ankle and joints of right foot: Secondary | ICD-10-CM

## 2022-10-04 MED ORDER — MELOXICAM 7.5 MG PO TABS
7.5000 mg | ORAL_TABLET | Freq: Two times a day (BID) | ORAL | 2 refills | Status: DC | PRN
Start: 2022-10-04 — End: 2024-01-30

## 2022-10-04 NOTE — Progress Notes (Signed)
Office Visit Note   Patient: Bradley Daniels           Date of Birth: [birth year]           MRN: [id number] Visit Date: 10/04/2022              Requested by: [name], MD [address] [place],  [place] [address] PCP: [name], MD   Assessment & Plan: Visit Diagnoses:  1. Primary osteoarthritis of right knee   2. Pain in right ankle and joints of right foot     Plan: Impression is 52 year old gentleman with advanced right knee DJD and lateral ankle pain.  He is not interested in steroid injections for the knee.  He would like to try NSAIDs first for the ankle pain.  If he decides he wants to try an injection plan would be for sinus Tarsi injection.  Follow-Up Instructions: No follow-ups on file.   Orders:  Orders Placed This Encounter  Procedures   XR KNEE 3 VIEW RIGHT   XR Ankle Complete Right   Meds ordered this encounter  Medications   meloxicam (MOBIC) 7.5 MG tablet    Sig: Take 1 tablet (7.5 mg total) by mouth 2 (two) times daily as needed for pain.    Dispense:  30 tablet    Refill:  2      Procedures: No procedures performed   Clinical Data: No additional findings.   Subjective: Chief Complaint  Patient presents with   Right Knee - Pain   Right Ankle - Pain    HPI  Patient returns today for follow-up of right knee and right ankle pain.  He states he has lateral heel pain all the time and is worse with standing.  This may be caused by the right knee pain.  He last had a cortisone injection in May 2023 which helped a little.  Review of Systems   Objective: Vital Signs: There were no vitals taken for this visit.  Physical Exam  Ortho Exam  Examination of the right knee shows pain and crepitus throughout range of motion.  Mild joint line tenderness.  Collaterals and cruciates are stable.  Crepitus with range of motion.  Examination of right ankle shows pain deep in the sinus Tarsi.  Peroneal tendons are nontender and  stable.  Specialty Comments:  No specialty comments available.  Imaging: XR KNEE 3 VIEW RIGHT  Result Date: 10/04/2022 X-rays demonstrate severe osteoarthritis.  Bone-on-bone joint space narrowing.  XR Ankle Complete Right  Result Date: 10/04/2022 Negative for acute findings.    PMFS History: Patient Active Problem List   Diagnosis Date Noted   Primary osteoarthritis of left knee 09/04/2019   Primary osteoarthritis of right knee 09/04/2019   Colon cancer screening 07/25/2019   Acute pain of right knee 07/25/2019   Acute idiopathic gout of right knee 07/25/2019   Essential hypertension 03/26/2019   Intractable chronic migraine without aura and with status migrainosus 03/26/2019   Exposure to syphilis 05/09/2017   Routine general medical examination at a health care facility 05/09/2017   Idiopathic gout 05/09/2017   Primary osteoarthritis of both knees 05/09/2017   Past Medical History:  Diagnosis Date   Arthritis    GERD (gastroesophageal reflux disease)     Family History  Problem Relation Age of Onset   Cancer Neg Hx    Drug abuse Neg Hx    Early death Neg Hx    Heart disease Neg Hx  Hyperlipidemia Neg Hx    Hypertension Neg Hx    Kidney disease Neg Hx    Stroke Neg Hx     History reviewed. No pertinent surgical history. Social History   Occupational History   Not on file  Tobacco Use   Smoking status: Never   Smokeless tobacco: Never  Substance and Sexual Activity   Alcohol use: No   Drug use: No   Sexual activity: Not on file

## 2022-11-15 ENCOUNTER — Ambulatory Visit: Payer: BC Managed Care – PPO | Admitting: Internal Medicine

## 2023-01-31 ENCOUNTER — Encounter: Payer: Self-pay | Admitting: Orthopaedic Surgery

## 2023-01-31 ENCOUNTER — Ambulatory Visit: Payer: BC Managed Care – PPO | Admitting: Orthopaedic Surgery

## 2023-01-31 DIAGNOSIS — M1712 Unilateral primary osteoarthritis, left knee: Secondary | ICD-10-CM

## 2023-01-31 DIAGNOSIS — M170 Bilateral primary osteoarthritis of knee: Secondary | ICD-10-CM | POA: Diagnosis not present

## 2023-01-31 DIAGNOSIS — M1711 Unilateral primary osteoarthritis, right knee: Secondary | ICD-10-CM | POA: Diagnosis not present

## 2023-01-31 DIAGNOSIS — M172 Bilateral post-traumatic osteoarthritis of knee: Secondary | ICD-10-CM | POA: Diagnosis not present

## 2023-01-31 MED ORDER — BUPIVACAINE HCL 0.5 % IJ SOLN
2.0000 mL | INTRAMUSCULAR | Status: AC | PRN
  Administered 2023-01-31: 2 mL via INTRA_ARTICULAR

## 2023-01-31 MED ORDER — METHYLPREDNISOLONE ACETATE 40 MG/ML IJ SUSP
40.0000 mg | INTRAMUSCULAR | Status: AC | PRN
Start: 2023-01-31 — End: 2023-01-31
  Administered 2023-01-31: 40 mg via INTRA_ARTICULAR

## 2023-01-31 MED ORDER — LIDOCAINE HCL 1 % IJ SOLN
2.0000 mL | INTRAMUSCULAR | Status: AC | PRN
Start: 2023-01-31 — End: 2023-01-31
  Administered 2023-01-31: 2 mL

## 2023-01-31 NOTE — Progress Notes (Signed)
Office Visit Note   Patient: Bradley Daniels           Date of Birth: 08/28/1970           MRN: [id number] Visit Date: 01/31/2023              Requested by: [name], MD [address] [place],  [place] [address] PCP: [name], MD   Assessment & Plan: Visit Diagnoses:  1. Primary osteoarthritis of right knee   2. Primary osteoarthritis of left knee     Plan: Patient is a 52 year old gentleman with bilateral knee osteoarthritis aggravation.  70 cc was aspirated from the left knee and cortisone was injected.  The right knee was injected with cortisone.  Patient tolerates well.  Follow-up as needed.  Follow-Up Instructions: No follow-ups on file.   Orders:  No orders of the defined types were placed in this encounter.  No orders of the defined types were placed in this encounter.     Procedures: Large Joint Inj: bilateral knee on 01/31/2023 4:17 PM Indications: pain Details: 22 G needle  Arthrogram: No  Medications (Right): 2 mL lidocaine 1 %; 2 mL bupivacaine 0.5 %; 40 mg methylPREDNISolone acetate 40 MG/ML Medications (Left): 2 mL lidocaine 1 %; 2 mL bupivacaine 0.5 %; 40 mg methylPREDNISolone acetate 40 MG/ML Outcome: tolerated well, no immediate complications Patient was prepped and draped in the usual sterile fashion.       Clinical Data: No additional findings.   Subjective: Chief Complaint  Patient presents with   Right Knee - Pain   Left Knee - Pain    HPI Patient returns today for bilateral knee pain.  He has known DJD.  He has had cortisone injections in the past with good relief.  He would like to repeat these today. Review of Systems  Constitutional: Negative.   HENT: Negative.    Eyes: Negative.   Respiratory: Negative.    Cardiovascular: Negative.   Gastrointestinal: Negative.   Endocrine: Negative.   Genitourinary: Negative.   Skin: Negative.   Allergic/Immunologic: Negative.   Neurological: Negative.   Hematological:  Negative.   Psychiatric/Behavioral: Negative.    All other systems reviewed and are negative.    Objective: Vital Signs: There were no vitals taken for this visit.  Physical Exam Vitals and nursing note reviewed.  Constitutional:      Appearance: He is well-developed.  Pulmonary:     Effort: Pulmonary effort is normal.  Abdominal:     Palpations: Abdomen is soft.  Skin:    General: Skin is warm.  Neurological:     Mental Status: He is alert and oriented to person, place, and time.  Psychiatric:        Behavior: Behavior normal.        Thought Content: Thought content normal.        Judgment: Judgment normal.     Ortho Exam Examination left knee shows large joint effusion.  Otherwise exam is unchanged. Examination of the right knee is unchanged. Specialty Comments:  No specialty comments available.  Imaging: No results found.   PMFS History: Patient Active Problem List   Diagnosis Date Noted   Primary osteoarthritis of left knee 09/04/2019   Primary osteoarthritis of right knee 09/04/2019   Colon cancer screening 07/25/2019   Acute pain of right knee 07/25/2019   Acute idiopathic gout of right knee 07/25/2019   Essential hypertension 03/26/2019   Intractable chronic migraine without aura and with status  migrainosus 03/26/2019   Exposure to syphilis 05/09/2017   Routine general medical examination at a health care facility 05/09/2017   Idiopathic gout 05/09/2017   Primary osteoarthritis of both knees 05/09/2017   Past Medical History:  Diagnosis Date   Arthritis    GERD (gastroesophageal reflux disease)     Family History  Problem Relation Age of Onset   Cancer Neg Hx    Drug abuse Neg Hx    Early death Neg Hx    Heart disease Neg Hx    Hyperlipidemia Neg Hx    Hypertension Neg Hx    Kidney disease Neg Hx    Stroke Neg Hx     History reviewed. No pertinent surgical history. Social History   Occupational History   Not on file  Tobacco Use    Smoking status: Never   Smokeless tobacco: Never  Substance and Sexual Activity   Alcohol use: No   Drug use: No   Sexual activity: Not on file

## 2024-01-04 ENCOUNTER — Telehealth: Payer: Self-pay | Admitting: Internal Medicine

## 2024-01-04 NOTE — Telephone Encounter (Signed)
 Copied from CRM [id number]. Topic: Appointments - Scheduling Inquiry for Clinic >> Jan 04, 2024 12:48 PM [name] wrote: Reason for CRM: patient was seeing Dr. Joshua before but it has been several years. Patient called today to try and make an appointment with him but since it has been awhile patient would now be considered a new patient. Dr. Joshua is not taking new patients but patient is wondering if he would take him back. I let patient know I would inquire and if not he may have to schedule with someone who is currently taking new patients. Please advise and contact patient.

## 2024-01-05 NOTE — Telephone Encounter (Signed)
 Get him scheduled please.

## 2024-01-05 NOTE — Telephone Encounter (Signed)
 Are you willing to take him as a new patient ?

## 2024-01-30 ENCOUNTER — Encounter: Payer: Self-pay | Admitting: Internal Medicine

## 2024-01-30 ENCOUNTER — Ambulatory Visit: Admitting: Internal Medicine

## 2024-01-30 VITALS — BP 128/92 | HR 64 | Temp 98.4°F | Resp 16 | Ht 74.0 in | Wt 225.0 lb

## 2024-01-30 DIAGNOSIS — I10 Essential (primary) hypertension: Secondary | ICD-10-CM | POA: Diagnosis not present

## 2024-01-30 DIAGNOSIS — Z1159 Encounter for screening for other viral diseases: Secondary | ICD-10-CM | POA: Diagnosis not present

## 2024-01-30 DIAGNOSIS — M1000 Idiopathic gout, unspecified site: Secondary | ICD-10-CM | POA: Diagnosis not present

## 2024-01-30 DIAGNOSIS — Z23 Encounter for immunization: Secondary | ICD-10-CM | POA: Diagnosis not present

## 2024-01-30 DIAGNOSIS — Z0000 Encounter for general adult medical examination without abnormal findings: Secondary | ICD-10-CM | POA: Diagnosis not present

## 2024-01-30 DIAGNOSIS — Z1211 Encounter for screening for malignant neoplasm of colon: Secondary | ICD-10-CM

## 2024-01-30 DIAGNOSIS — Z0001 Encounter for general adult medical examination with abnormal findings: Secondary | ICD-10-CM

## 2024-01-30 LAB — BASIC METABOLIC PANEL WITH GFR
BUN: 9 mg/dL (ref 6–23)
CO2: 29 meq/L (ref 19–32)
Calcium: 9.2 mg/dL (ref 8.4–10.5)
Chloride: 105 meq/L (ref 96–112)
Creatinine, Ser: 0.94 mg/dL (ref 0.40–1.50)
GFR: 91.21 mL/min (ref >60.00)
Glucose, Bld: 99 mg/dL (ref 70–99)
Potassium: 4.3 meq/L (ref 3.5–5.1)
Sodium: 138 meq/L (ref 135–145)

## 2024-01-30 LAB — LIPID PANEL
Cholesterol: 141 mg/dL (ref 0–200)
HDL: 59.4 mg/dL (ref >39.00)
LDL Cholesterol: 72 mg/dL (ref 0–99)
NonHDL: 82.04
Total CHOL/HDL Ratio: 2
Triglycerides: 52 mg/dL (ref 0.0–149.0)
VLDL: 10.4 mg/dL (ref 0.0–40.0)

## 2024-01-30 LAB — HEPATIC FUNCTION PANEL
ALT: 35 U/L (ref 0–53)
AST: 30 U/L (ref 0–37)
Albumin: 4.3 g/dL (ref 3.5–5.2)
Alkaline Phosphatase: 73 U/L (ref 39–117)
Bilirubin, Direct: 0.1 mg/dL (ref 0.0–0.3)
Total Bilirubin: 0.6 mg/dL (ref 0.2–1.2)
Total Protein: 6.7 g/dL (ref 6.0–8.3)

## 2024-01-30 LAB — CBC WITH DIFFERENTIAL/PLATELET
Basophils Absolute: 0 K/uL (ref 0.0–0.1)
Basophils Relative: 0.2 % (ref 0.0–3.0)
Eosinophils Absolute: 0.2 K/uL (ref 0.0–0.7)
Eosinophils Relative: 3.7 % (ref 0.0–5.0)
HCT: 45.5 % (ref 39.0–52.0)
Hemoglobin: 14.9 g/dL (ref 13.0–17.0)
Lymphocytes Relative: 41.9 % (ref 12.0–46.0)
Lymphs Abs: 1.9 K/uL (ref 0.7–4.0)
MCHC: 32.7 g/dL (ref 30.0–36.0)
MCV: 80.7 fl (ref 78.0–100.0)
Monocytes Absolute: 0.3 K/uL (ref 0.1–1.0)
Monocytes Relative: 7.1 % (ref 3.0–12.0)
Neutro Abs: 2.1 K/uL (ref 1.4–7.7)
Neutrophils Relative %: 47.1 % (ref 43.0–77.0)
Platelets: 210 K/uL (ref 150.0–400.0)
RBC: 5.63 Mil/uL (ref 4.22–5.81)
RDW: 13.7 % (ref 11.5–15.5)
WBC: 4.5 K/uL (ref 4.0–10.5)

## 2024-01-30 LAB — URIC ACID: Uric Acid, Serum: 6.7 mg/dL (ref 4.0–7.8)

## 2024-01-30 LAB — TSH: TSH: 1.13 u[IU]/mL (ref 0.35–5.50)

## 2024-01-30 LAB — PSA: PSA: 0.61 ng/mL (ref 0.10–4.00)

## 2024-01-30 NOTE — Progress Notes (Signed)
 Subjective:  Patient ID: Bradley Daniels, male    DOB: Feb 09, 1971  Age: 53 y.o. MRN: [id number]  CC: Annual Exam, Hypertension, Osteoarthritis, and Back Pain   HPI [name] presents for a CPX and f/up ---  Discussed the use of AI scribe software for clinical note transcription with the patient, who gave verbal consent to proceed.  History of Present Illness Bradley Daniels is a 53 year old male who presents with back pain and headaches.  He experiences intermittent back pain, which he attributes to possibly 'too much work'. The pain sometimes radiates to his knees. He takes ibuprofen  for relief. No numbness, weakness, or tingling in his arms or legs.  He also experiences intermittent headaches without specific triggers or relieving factors identified.  No chest pain, shortness of breath, nausea, vomiting, or blurred vision. He sometimes engages in physical activity such as walking or running.    Outpatient Medications Prior to Visit  Medication Sig Dispense Refill   Atogepant  (QULIPTA ) 60 MG TABS Take 1 tablet by mouth daily. 90 tablet 1   diclofenac  (VOLTAREN ) 75 MG EC tablet TAKE 1 TABLET(75 MG) BY MOUTH TWICE DAILY 30 tablet 2   meloxicam  (MOBIC ) 7.5 MG tablet Take 1 tablet (7.5 mg total) by mouth 2 (two) times daily as needed for pain. 30 tablet 2   MITIGARE  0.6 MG CAPS Take 1 capsule by mouth 2 (two) times daily. 60 capsule 5   traMADol  (ULTRAM ) 50 MG tablet Take 1 tablet (50 mg total) by mouth 2 (two) times daily as needed. 20 tablet 2   No facility-administered medications prior to visit.    ROS Review of Systems  Constitutional:  Negative for appetite change, chills, diaphoresis, fatigue and fever.  HENT: Negative.    Eyes: Negative.   Respiratory: Negative.  Negative for cough, chest tightness, shortness of breath and wheezing.   Cardiovascular:  Negative for chest pain, palpitations and leg swelling.  Gastrointestinal:  Negative for abdominal pain, blood in  stool, constipation, diarrhea, nausea and vomiting.  Genitourinary: Negative.  Negative for difficulty urinating.  Musculoskeletal:  Positive for arthralgias and back pain. Negative for myalgias and neck pain.  Skin: Negative.   Neurological:  Positive for headaches. Negative for dizziness and weakness.  Hematological:  Negative for adenopathy. Does not bruise/bleed easily.  Psychiatric/Behavioral: Negative.      Objective:  BP (!) 128/92 (BP Location: Left Arm, Patient Position: Sitting, Cuff Size: Normal) Comment: BP (R) 128/92  Pulse 64   Temp 98.4 F (36.9 C) (Oral)   Resp 16   Ht 6' 2 (1.88 m)   Wt 225 lb (102.1 kg)   SpO2 97%   BMI 28.89 kg/m   BP Readings from Last 3 Encounters:  01/30/24 (!) 128/92  06/10/20 128/86  07/25/19 130/80    Wt Readings from Last 3 Encounters:  01/30/24 225 lb (102.1 kg)  06/10/20 233 lb (105.7 kg)  01/07/20 229 lb (103.9 kg)    Physical Exam Vitals reviewed.  Constitutional:      Appearance: Normal appearance.  HENT:     Nose: Nose normal.     Mouth/Throat:     Mouth: Mucous membranes are moist.  Eyes:     General: No scleral icterus.    Conjunctiva/sclera: Conjunctivae normal.  Cardiovascular:     Rate and Rhythm: Normal rate and regular rhythm.     Pulses: Normal pulses.     Heart sounds: No murmur heard.    No friction rub. No gallop.  Comments: EKG---  NSR, 62 bpm No LVH, Q waves, or ST/T wave changes  Pulmonary:     Effort: Pulmonary effort is normal.     Breath sounds: No stridor. No wheezing, rhonchi or rales.  Abdominal:     General: Abdomen is flat.     Palpations: There is no mass.     Tenderness: There is no abdominal tenderness. There is no guarding.     Hernia: No hernia is present. There is no hernia in the left inguinal area or right inguinal area.  Genitourinary:    Pubic Area: No rash.      Penis: Normal and circumcised.      Testes: Normal.     Epididymis:     Right: Normal. Not inflamed or  enlarged. No mass.     Left: Not inflamed or enlarged. No mass.     Prostate: Normal. Not enlarged, not tender and no nodules present.     Rectum: Normal. Guaiac result negative. No mass, tenderness, anal fissure, external hemorrhoid or internal hemorrhoid. Normal anal tone.  Musculoskeletal:        General: Normal range of motion.     Cervical back: Neck supple.     Right lower leg: No edema.     Left lower leg: No edema.  Lymphadenopathy:     Cervical: No cervical adenopathy.     Lower Body: No right inguinal adenopathy. No left inguinal adenopathy.  Skin:    General: Skin is warm and dry.     Findings: No rash.  Neurological:     General: No focal deficit present.     Mental Status: He is alert.  Psychiatric:        Mood and Affect: Mood normal.        Behavior: Behavior normal.     Lab Results  Component Value Date   WBC 6.1 03/26/2019   HGB 14.5 03/26/2019   HCT 43.9 03/26/2019   PLT 240.0 03/26/2019   GLUCOSE 101 (H) 03/26/2019   CHOL 140 07/26/2019   TRIG 54.0 07/26/2019   HDL 54.70 07/26/2019   LDLCALC 74 07/26/2019   ALT 32 03/26/2019   AST 35 03/26/2019   NA 138 03/26/2019   K 3.8 03/26/2019   CL 105 03/26/2019   CREATININE 1.05 03/26/2019   BUN 12 03/26/2019   CO2 26 03/26/2019   TSH 0.88 03/26/2019   PSA 0.51 07/26/2019    MR Brain Wo Contrast Result Date: 05/12/2019 CLINICAL DATA:  Initial evaluation for chronic headaches, dizziness. EXAM: MRI HEAD WITHOUT CONTRAST TECHNIQUE: Multiplanar, multiecho pulse sequences of the brain and surrounding structures were obtained without intravenous contrast. COMPARISON:  None. FINDINGS: Brain: Cerebral volume within normal limits for patient age. Few scattered subcentimeter foci of T2/FLAIR hyperintensity noted involving the periventricular and subcortical white matter both cerebral hemispheres, nonspecific. No abnormal foci of restricted diffusion to suggest acute or subacute ischemia. Gray-white matter  differentiation well maintained. No encephalomalacia to suggest chronic infarction. No foci of susceptibility artifact to suggest acute or chronic intracranial hemorrhage. No mass lesion, midline shift or mass effect. No hydrocephalus. No extra-axial fluid collection. Major dural sinuses are grossly patent. Incidental note made of a partially empty sella. Suprasellar region normal. Midline structures intact and normal. Vascular: Major intracranial vascular flow voids well maintained and normal in appearance. Skull and upper cervical spine: Craniocervical junction normal. Visualized upper cervical spine within normal limits. Bone marrow signal intensity normal. No scalp soft tissue abnormality. Sinuses/Orbits: Globes and orbital  soft tissues within normal limits. Paranasal sinuses are clear. No mastoid effusion. Inner ear structures normal. Other: None. IMPRESSION: 1. Few scattered foci of subcentimeter T2/FLAIR hyperintensities involving the supratentorial cerebral white matter. Findings are nonspecific, with primary differential considerations consisting of changes related to mild chronic small vessel ischemic disease, migrainous disorder, or other prior infectious or inflammatory disorder. 2. Otherwise unremarkable and normal brain MRI for age. Electronically Signed   By: Morene Hoard M.D.   On: 05/12/2019 21:19    Assessment & Plan:  Need for hepatitis C screening test -     Hepatitis C antibody; Future  Screening for colon cancer -     Ambulatory referral to Gastroenterology  Idiopathic gout, unspecified chronicity, unspecified site -     Hepatic function panel; Future -     Uric acid; Future  Essential hypertension -     TSH; Future -     Urinalysis, Routine w reflex microscopic; Future -     Hepatic function panel; Future -     Basic metabolic panel with GFR; Future -     CBC with Differential/Platelet; Future -     EKG 12-Lead  Encounter for general adult medical examination with  abnormal findings -     Lipid panel; Future -     PSA; Future  Immunization due -     Pneumococcal conjugate vaccine 20-valent -     Varicella-zoster vaccine IM     Follow-up: Return in about 6 months (around 08/01/2024).  Debby Molt, MD

## 2024-01-30 NOTE — Patient Instructions (Signed)
 Health Maintenance, Male  Adopting a healthy lifestyle and getting preventive care are important in promoting health and wellness. Ask your health care provider about:  The right schedule for you to have regular tests and exams.  Things you can do on your own to prevent diseases and keep yourself healthy.  What should I know about diet, weight, and exercise?  Eat a healthy diet    Eat a diet that includes plenty of vegetables, fruits, low-fat dairy products, and lean protein.  Do not eat a lot of foods that are high in solid fats, added sugars, or sodium.  Maintain a healthy weight  Body mass index (BMI) is a measurement that can be used to identify possible weight problems. It estimates body fat based on height and weight. Your health care provider can help determine your BMI and help you achieve or maintain a healthy weight.  Get regular exercise  Get regular exercise. This is one of the most important things you can do for your health. Most adults should:  Exercise for at least 150 minutes each week. The exercise should increase your heart rate and make you sweat (moderate-intensity exercise).  Do strengthening exercises at least twice a week. This is in addition to the moderate-intensity exercise.  Spend less time sitting. Even light physical activity can be beneficial.  Watch cholesterol and blood lipids  Have your blood tested for lipids and cholesterol at 55 years of age, then have this test every 5 years.  You may need to have your cholesterol levels checked more often if:  Your lipid or cholesterol levels are high.  You are older than 55 years of age.  You are at high risk for heart disease.  What should I know about cancer screening?  Many types of cancers can be detected early and may often be prevented. Depending on your health history and family history, you may need to have cancer screening at various ages. This may include screening for:  Colorectal cancer.  Prostate cancer.  Skin cancer.  Lung  cancer.  What should I know about heart disease, diabetes, and high blood pressure?  Blood pressure and heart disease  High blood pressure causes heart disease and increases the risk of stroke. This is more likely to develop in people who have high blood pressure readings or are overweight.  Talk with your health care provider about your target blood pressure readings.  Have your blood pressure checked:  Every 3-5 years if you are 9-95 years of age.  Every year if you are 85 years old or older.  If you are between the ages of 29 and 29 and are a current or former smoker, ask your health care provider if you should have a one-time screening for abdominal aortic aneurysm (AAA).  Diabetes  Have regular diabetes screenings. This checks your fasting blood sugar level. Have the screening done:  Once every three years after age 23 if you are at a normal weight and have a low risk for diabetes.  More often and at a younger age if you are overweight or have a high risk for diabetes.  What should I know about preventing infection?  Hepatitis B  If you have a higher risk for hepatitis B, you should be screened for this virus. Talk with your health care provider to find out if you are at risk for hepatitis B infection.  Hepatitis C  Blood testing is recommended for:  Everyone born from [birth year] through 1967.  Anyone  with known risk factors for hepatitis C.  Sexually transmitted infections (STIs)  You should be screened each year for STIs, including gonorrhea and chlamydia, if:  You are sexually active and are younger than 55 years of age.  You are older than 55 years of age and your health care provider tells you that you are at risk for this type of infection.  Your sexual activity has changed since you were last screened, and you are at increased risk for chlamydia or gonorrhea. Ask your health care provider if you are at risk.  Ask your health care provider about whether you are at high risk for HIV. Your health care provider  may recommend a prescription medicine to help prevent HIV infection. If you choose to take medicine to prevent HIV, you should first get tested for HIV. You should then be tested every 3 months for as long as you are taking the medicine.  Follow these instructions at home:  Alcohol use  Do not drink alcohol if your health care provider tells you not to drink.  If you drink alcohol:  Limit how much you have to 0-2 drinks a day.  Know how much alcohol is in your drink. In the [place] one drink equals one 12 oz bottle of beer (355 mL), one 5 oz glass of wine (148 mL), or one 1 oz glass of hard liquor (44 mL).  Lifestyle  Do not use any products that contain nicotine or tobacco. These products include cigarettes, chewing tobacco, and vaping devices, such as e-cigarettes. If you need help quitting, ask your health care provider.  Do not use street drugs.  Do not share needles.  Ask your health care provider for help if you need support or information about quitting drugs.  General instructions  Schedule regular health, dental, and eye exams.  Stay current with your vaccines.  Tell your health care provider if:  You often feel depressed.  You have ever been abused or do not feel safe at home.  Summary  Adopting a healthy lifestyle and getting preventive care are important in promoting health and wellness.  Follow your health care provider's instructions about healthy diet, exercising, and getting tested or screened for diseases.  Follow your health care provider's instructions on monitoring your cholesterol and blood pressure.  This information is not intended to replace advice given to you by your health care provider. Make sure you discuss any questions you have with your health care provider.  Document Revised: 11/02/2020 Document Reviewed: 11/02/2020  Elsevier Patient Education  2024 [employer].

## 2024-01-31 ENCOUNTER — Ambulatory Visit: Payer: Self-pay | Admitting: Internal Medicine

## 2024-01-31 DIAGNOSIS — Z0001 Encounter for general adult medical examination with abnormal findings: Secondary | ICD-10-CM | POA: Insufficient documentation

## 2024-01-31 DIAGNOSIS — Z23 Encounter for immunization: Secondary | ICD-10-CM | POA: Insufficient documentation

## 2024-01-31 LAB — URINALYSIS, ROUTINE W REFLEX MICROSCOPIC
Bilirubin Urine: NEGATIVE
Hgb urine dipstick: NEGATIVE
Ketones, ur: NEGATIVE
Leukocytes,Ua: NEGATIVE
Nitrite: NEGATIVE
RBC / HPF: NONE SEEN (ref 0–?)
Specific Gravity, Urine: 1.015 (ref 1.000–1.030)
Total Protein, Urine: NEGATIVE
Urine Glucose: NEGATIVE
Urobilinogen, UA: 0.2 (ref 0.0–1.0)
pH: 7.5 (ref 5.0–8.0)

## 2024-01-31 LAB — HEPATITIS C ANTIBODY: Hepatitis C Ab: NONREACTIVE

## 2024-01-31 MED ORDER — INDAPAMIDE 1.25 MG PO TABS: 1.2500 mg | ORAL_TABLET | Freq: Every day | ORAL | 1 refills | Status: AC

## 2024-04-03 ENCOUNTER — Encounter: Payer: Self-pay | Admitting: Internal Medicine

## 2024-04-19 ENCOUNTER — Ambulatory Visit: Admitting: Internal Medicine
